# Patient Record
Sex: Male | Born: 1980 | Race: Black or African American | Hispanic: No | Marital: Single | Smoking: Current every day smoker
Health system: Southern US, Community
[De-identification: ages and names within clinical notes are randomized; demographics above are authoritative.]

## PROBLEM LIST (undated history)

## (undated) DIAGNOSIS — F419 Anxiety disorder, unspecified: Secondary | ICD-10-CM

## (undated) DIAGNOSIS — F1910 Other psychoactive substance abuse, uncomplicated: Secondary | ICD-10-CM

## (undated) DIAGNOSIS — B20 Human immunodeficiency virus [HIV] disease: Secondary | ICD-10-CM

## (undated) HISTORY — PX: WISDOM TOOTH EXTRACTION: SHX21

---

## 2013-07-20 ENCOUNTER — Emergency Department (HOSPITAL_COMMUNITY)
Admission: EM | Admit: 2013-07-20 | Discharge: 2013-07-21 | Disposition: A | Discharge status: Home / Self Care | Payer: BC Managed Care – PPO | Attending: Emergency Medicine | Admitting: Emergency Medicine

## 2013-07-20 ENCOUNTER — Encounter (HOSPITAL_COMMUNITY): Payer: Self-pay | Admitting: Emergency Medicine

## 2013-07-20 DIAGNOSIS — E876 Hypokalemia: Secondary | ICD-10-CM | POA: Insufficient documentation

## 2013-07-20 DIAGNOSIS — F17200 Nicotine dependence, unspecified, uncomplicated: Secondary | ICD-10-CM | POA: Insufficient documentation

## 2013-07-20 DIAGNOSIS — IMO0001 Reserved for inherently not codable concepts without codable children: Secondary | ICD-10-CM | POA: Insufficient documentation

## 2013-07-20 DIAGNOSIS — F1910 Other psychoactive substance abuse, uncomplicated: Secondary | ICD-10-CM

## 2013-07-20 DIAGNOSIS — R209 Unspecified disturbances of skin sensation: Secondary | ICD-10-CM | POA: Insufficient documentation

## 2013-07-20 DIAGNOSIS — R002 Palpitations: Secondary | ICD-10-CM | POA: Insufficient documentation

## 2013-07-20 DIAGNOSIS — F411 Generalized anxiety disorder: Secondary | ICD-10-CM | POA: Insufficient documentation

## 2013-07-20 DIAGNOSIS — F1510 Other stimulant abuse, uncomplicated: Secondary | ICD-10-CM | POA: Insufficient documentation

## 2013-07-20 DIAGNOSIS — R000 Tachycardia, unspecified: Secondary | ICD-10-CM | POA: Insufficient documentation

## 2013-07-20 LAB — BASIC METABOLIC PANEL
BUN: 13 mg/dL (ref 6–23)
Calcium: 10.4 mg/dL (ref 8.4–10.5)
Chloride: 101 mEq/L (ref 96–112)
Creatinine, Ser: 1.08 mg/dL (ref 0.50–1.35)
GFR calc Af Amer: >90 mL/min (ref >90)
GFR calc non Af Amer: 89 mL/min — ABNORMAL LOW (ref >90)
Potassium: 3.4 mEq/L — ABNORMAL LOW (ref 3.5–5.1)

## 2013-07-20 LAB — POCT I-STAT TROPONIN I: Troponin i, poc: 0.03 ng/mL (ref 0.00–0.08)

## 2013-07-20 LAB — CBC
MCHC: 36.7 g/dL — ABNORMAL HIGH (ref 30.0–36.0)
Platelets: 296 10*3/uL (ref 150–400)
RDW: 11.2 % — ABNORMAL LOW (ref 11.5–15.5)
WBC: 7 10*3/uL (ref 4.0–10.5)

## 2013-07-20 MED ORDER — POTASSIUM CHLORIDE CRYS ER 20 MEQ PO TBCR
40.0000 meq | EXTENDED_RELEASE_TABLET | Freq: Once | ORAL | Status: AC
  Administered 2013-07-20: 40 meq via ORAL
  Filled 2013-07-20: qty 2

## 2013-07-20 MED ORDER — LORAZEPAM 2 MG/ML IJ SOLN
1.0000 mg | Freq: Once | INTRAMUSCULAR | Status: AC
  Administered 2013-07-20: 1 mg via INTRAVENOUS
  Filled 2013-07-20: qty 1

## 2013-07-20 MED ORDER — SODIUM CHLORIDE 0.9 % IV BOLUS (SEPSIS)
1000.0000 mL | Freq: Once | INTRAVENOUS | Status: AC
  Administered 2013-07-20: 1000 mL via INTRAVENOUS

## 2013-07-20 NOTE — ED Provider Notes (Signed)
CSN: [id number]     Arrival date & time 07/20/13  2058 History   First MD Initiated Contact with Patient 07/20/13 2118     Chief Complaint  Patient presents with  . Tachycardia   (Consider location/radiation/quality/duration/timing/severity/associated sxs/prior Treatment) HPI Pt presents after injecting crystal meth.  He states he feels diffuse body pain and feels his heart racing and pounding.  Pt describes feeling pain in all 4 extremities, back and chest.  Pt feels that he is breathing fast and anxious.  He feels tingling in all 4 extremities.  He states he has used crystal meth in the past and has had similar symptoms.  Pt is requesting to leave the ED quickly as he states he needs to get to work in less than 2 hours.  No fainting.  Denies using other drugs or drinking alcohol, denies using cocaine.  There are no other associated systemic symptoms, there are no other alleviating or modifying factors.   History reviewed. No pertinent past medical history. History reviewed. No pertinent past surgical history. History reviewed. No pertinent family history. History  Substance Use Topics  . Smoking status: Current Every Day Smoker    Types: Cigarettes  . Smokeless tobacco: Not on file  . Alcohol Use: No    Review of Systems ROS reviewed and all otherwise negative except for mentioned in HPI  Allergies  Review of patient's allergies indicates no known allergies.  Home Medications  No current outpatient prescriptions on file. BP 137/94  Pulse 100  Temp(Src) 98.1 F (36.7 C) (Oral)  Resp 19  SpO2 100% Vitals reviewed Physical Exam Physical Examination: General appearance - alert, anxious appearing, and in no distress Mental status - alert, oriented to person, place, and time Eyes - pupils equal and reactive, mild conjunctival injection, no scleral icterus Mouth - mucous membranes moist, pharynx normal without lesions Chest - clear to auscultation, no wheezes, rales or rhonchi,  symmetric air entry Heart - normal rate, regular rhythm, normal S1, S2, no murmurs, rubs, clicks or gallops Abdomen - soft, nontender, nondistended, no masses or organomegaly Extremities - peripheral pulses normal, no pedal edema, no clubbing or cyanosis Skin - normal coloration and turgor, no rashes  ED Course  Procedures (including critical care time) 11:26 PM on recheck patient is feeling much improved.  HR is 100 on my re-evaluation.  He states he feels much more calm and symptoms are improved.  He is finishing fluid bolus and HR increases to approx 110 upon sitting up.  Will discharge after fluids completed.   Labs Review Labs Reviewed  CBC - Abnormal; Notable for the following:    MCHC 36.7 (*)    RDW 11.2 (*)    All other components within normal limits  BASIC METABOLIC PANEL - Abnormal; Notable for the following:    Potassium 3.4 (*)    GFR calc non Af Amer 89 (*)    All other components within normal limits  POCT I-STAT TROPONIN I   Imaging Review No results found.  EKG Interpretation    Date/Time:  Monday July 20 2013 21:03:54 EST Ventricular Rate:  152 PR Interval:  126 QRS Duration: 90 QT Interval:  326 QTC Calculation: 518 R Axis:   85 Text Interpretation:  Sinus tachycardia Otherwise normal ECG No old tracing to compare            MDM   1. Substance abuse   2. Tachycardia   3. Palpitations   4. Hypokalemia    Pt  presenting with palpitations, tachycardia, diffuse body aches and anxiety after injecting crystal meth.  EKG shows sinus tach.  HR decreased after IV hydration and ativan.  He is feeling much improved after treatment.  Lungs are clear and there is no respiratory distress.  Pt concerned about missing work.  I have counseled him against the use of illicit drugs, he denies using cocaine.  He is requesting vicodin- and states he "doesn't want to have to buy it on the street".  His vitals are reassuring on recheck, I have advised ibuprofen for  body aches.  Doubt ACS or PE.  Discharged with strict return precautions.  Pt agreeable with plan.    Ethelda Chick, MD 07/21/13 [phone]

## 2013-07-20 NOTE — ED Notes (Signed)
Pt resting at this time. Pt voided 400cc urine.  No c/o at this time.

## 2013-07-20 NOTE — ED Notes (Signed)
Presents post one hour ago injection of crystal meth, became SOB, diaphoretic and feels like heart is beating out of chest. HR 150s-160s,  Diaphoretic. C/o chest pain and tingling and numbness in extremities.

## 2013-07-21 NOTE — ED Notes (Signed)
Pt dc to home. Pt sts understanding to dc instructions.pt requesting something for pain to take home. md asked and no new orders obtained.  Md advised pt to go home and rest.  Pt sts understanding to this.

## 2014-08-25 ENCOUNTER — Encounter (HOSPITAL_COMMUNITY): Payer: Self-pay | Admitting: *Deleted

## 2014-08-25 ENCOUNTER — Emergency Department (HOSPITAL_COMMUNITY)
Admission: EM | Admit: 2014-08-25 | Discharge: 2014-08-25 | Disposition: A | Discharge status: Home / Self Care | Payer: BC Managed Care – PPO | Attending: Emergency Medicine | Admitting: Emergency Medicine

## 2014-08-25 DIAGNOSIS — B350 Tinea barbae and tinea capitis: Secondary | ICD-10-CM | POA: Insufficient documentation

## 2014-08-25 DIAGNOSIS — K029 Dental caries, unspecified: Secondary | ICD-10-CM | POA: Diagnosis not present

## 2014-08-25 DIAGNOSIS — Z720 Tobacco use: Secondary | ICD-10-CM | POA: Diagnosis not present

## 2014-08-25 DIAGNOSIS — Z21 Asymptomatic human immunodeficiency virus [HIV] infection status: Secondary | ICD-10-CM | POA: Diagnosis not present

## 2014-08-25 DIAGNOSIS — K088 Other specified disorders of teeth and supporting structures: Secondary | ICD-10-CM | POA: Diagnosis present

## 2014-08-25 HISTORY — DX: Human immunodeficiency virus (HIV) disease: B20

## 2014-08-25 MED ORDER — HYDROCODONE-ACETAMINOPHEN 5-325 MG PO TABS: 1.0000 | ORAL_TABLET | ORAL | Status: DC | PRN

## 2014-08-25 MED ORDER — PENICILLIN V POTASSIUM 500 MG PO TABS: 500.0000 mg | ORAL_TABLET | Freq: Three times a day (TID) | ORAL | Status: DC

## 2014-08-25 MED ORDER — GRISEOFULVIN MICROSIZE 500 MG PO TABS: 500.0000 mg | ORAL_TABLET | Freq: Every day | ORAL | Status: DC

## 2014-08-25 NOTE — ED Provider Notes (Signed)
CSN: [id number]     Arrival date & time 08/25/14  2108 History  This chart was scribed for non-physician practitioner, Elpidio AnisShari Samayra Hebel, PA-C, working with No att. providers found, by Bronson CurbJacqueline Melvin, ED Scribe. This patient was seen in room TR09C/TR09C and the patient's care was started at 9:27 PM.   Chief Complaint  Patient presents with  . Dental Pain  . Rash  . STD check     The history is provided by the patient. No language interpreter was used.     HPI Comments: Douglas Dunn is a 33 y.o. male, with history of HIV, who presents to the Emergency Department complaining of right upper dental pain onset PTA. Patient reports history of similar dental pain that resulted in a tooth extraction. He suspects more teeth need to be extracted. He denies fever. Patient has dental insurance, and is a current smoker.   Patient is complaining of rash to the scalp for the past 3 month. He states he was placed on a week's worth of antifungal medication (Diflucan), without significant improvement. Patient is concerned this is related to HIV, and reports that his CD4 count is high. He also notes dietary changes, stating that he has been consuming approximately 4,000-5,000 calories per day.  Patient also notes exposure to Syphilis and was given Penicillin at the health department. He reports that he is to follow-up in the next month.   Past Medical History  Diagnosis Date  . HIV (human immunodeficiency virus infection)    History reviewed. No pertinent past surgical history. History reviewed. No pertinent family history. History  Substance Use Topics  . Smoking status: Current Every Day Smoker    Types: Cigarettes  . Smokeless tobacco: Not on file  . Alcohol Use: No    Review of Systems  Constitutional: Positive for appetite change (increase). Negative for fever.  HENT: Positive for dental problem.   Skin: Positive for rash.      Allergies  Review of patient's allergies indicates no known  allergies.  Home Medications   Prior to Admission medications   Not on File   Triage Vitals: BP 154/99 mmHg  Pulse 110  Temp(Src) 98 F (36.7 C)  Resp 18  Ht 6\' 1"  (1.854 m)  Wt 184 lb (83.462 kg)  BMI 24.28 kg/m2  SpO2 98%  Physical Exam  Constitutional: He is oriented to person, place, and time. He appears well-developed and well-nourished. No distress.  HENT:  Head: Normocephalic and atraumatic.  Partially edentulous. Severe decay of number 4 and 5 with associated gingival erythema. No facial swelling. No adennopathy.  Eyes: Conjunctivae and EOM are normal.  Neck: Neck supple. No tracheal deviation present.  Cardiovascular: Normal rate.   Pulmonary/Chest: Effort normal. No respiratory distress.  Musculoskeletal: Normal range of motion.  Neurological: He is alert and oriented to person, place, and time.  Skin: Skin is warm and dry.  Multiple areas of alopecia. No erythema. Mildly tender.  Psychiatric: He has a normal mood and affect. His behavior is normal.  Nursing note and vitals reviewed.   ED Course  Procedures (including critical care time)  DIAGNOSTIC STUDIES: Oxygen Saturation is 98% on room air, normal by my interpretation.    COORDINATION OF CARE: At 2134 Discussed treatment plan with patient. Patient agrees.   Labs Review Labs Reviewed - No data to display  Imaging Review No results found.   EKG Interpretation None      MDM   Final diagnoses:  None    1.  Dental pain 2. Tinea capitis  The patient has a concern about previous positive test for syphilis. He reports he was given penicillin. No current symptoms. He is reassured that no further testing is needed through the emergency department and is encouraged to follow up with his PCP.  He is started on griseofulvin for tinea capitis. Discussed the needed length of treatment. He [facility] and is encouraged to do this for recheck.  He is  treated for dental pain with abx and pain management.   I personally performed the services described in this documentation, which was scribed in my presence. The recorded information has been reviewed and is accurate.     Arnoldo HookerShari A Cristiana Yochim, PA-C 08/26/14 0000  Flint MelterElliott L Wentz, MD 08/26/14 [phone]

## 2014-08-25 NOTE — ED Notes (Signed)
pts vital signs updated pt awaiting discharge paperwork at bedside.  

## 2014-08-25 NOTE — ED Notes (Signed)
Pt in c/o toothache, rash to scalp, and states he was exposed to syphilis a few months ago- states he was treated at the health department but he hasn't been able to follow up for a re-check

## 2014-08-25 NOTE — Discharge Instructions (Signed)
Dental Caries °Dental caries (also called tooth decay) is the most common oral disease. It can occur at any age but is more common in children and young adults.  °HOW DENTAL CARIES DEVELOPS  °The process of decay begins when bacteria and foods (particularly sugars and starches) combine in your mouth to produce plaque. Plaque is a substance that sticks to the hard, outer surface of a tooth (enamel). The bacteria in plaque produce acids that attack enamel. These acids may also attack the root surface of a tooth (cementum) if it is exposed. Repeated attacks dissolve these surfaces and create holes in the tooth (cavities). If left untreated, the acids destroy the other layers of the tooth.  °RISK FACTORS °· Frequent sipping of sugary beverages.   °· Frequent snacking on sugary and starchy foods, especially those that easily get stuck in the teeth.   °· Poor oral hygiene.   °· Dry mouth.   °· Substance abuse such as methamphetamine abuse.   °· Broken or poor-fitting dental restorations.   °· Eating disorders.   °· Gastroesophageal reflux disease (GERD).   °· Certain radiation treatments to the head and neck. °SYMPTOMS °In the early stages of dental caries, symptoms are seldom present. Sometimes white, chalky areas may be seen on the enamel or other tooth layers. In later stages, symptoms may include: °· Pits and holes on the enamel. °· Toothache after sweet, hot, or cold foods or drinks are consumed. °· Pain around the tooth. °· Swelling around the tooth. °DIAGNOSIS  °Most of the time, dental caries is detected during a regular dental checkup. A diagnosis is made after a thorough medical and dental history is taken and the surfaces of your teeth are checked for signs of dental caries. Sometimes special instruments, such as lasers, are used to check for dental caries. Dental X-ray exams may be taken so that areas not visible to the eye (such as between the contact areas of the teeth) can be checked for cavities.    °TREATMENT  °If dental caries is in its early stages, it may be reversed with a fluoride treatment or an application of a remineralizing agent at the dental office. Thorough brushing and flossing at home is needed to aid these treatments. If it is in its later stages, treatment depends on the location and extent of tooth destruction:  °· If a small area of the tooth has been destroyed, the destroyed area will be removed and cavities will be filled with a material such as gold, silver amalgam, or composite resin.   °· If a large area of the tooth has been destroyed, the destroyed area will be removed and a cap (crown) will be fitted over the remaining tooth structure.   °· If the center part of the tooth (pulp) is affected, a procedure called a root canal will be needed before a filling or crown can be placed.   °· If most of the tooth has been destroyed, the tooth may need to be pulled (extracted). °HOME CARE INSTRUCTIONS °You can prevent, stop, or reverse dental caries at home by practicing good oral hygiene. Good oral hygiene includes: °· Thoroughly cleaning your teeth at least twice a day with a toothbrush and dental floss.   °· Using a fluoride toothpaste. A fluoride mouth rinse may also be used if recommended by your dentist or health care provider.   °· Restricting the amount of sugary and starchy foods and sugary liquids you consume.   °· Avoiding frequent snacking on these foods and sipping of these liquids.   °· Keeping regular visits with   a dentist for checkups and cleanings. PREVENTION   Practice good oral hygiene.  Consider a dental sealant. A dental sealant is a coating material that is applied by your dentist to the pits and grooves of teeth. The sealant prevents food from being trapped in them. It may protect the teeth for several years.  Ask about fluoride supplements if you live in a community without fluorinated water or with water that has a low fluoride content. Use fluoride supplements  as directed by your dentist or health care provider.  Allow fluoride varnish applications to teeth if directed by your dentist or health care provider. Document Released: 05/12/2002 Document Revised: 01/04/2014 Document Reviewed: 08/22/2012 [facility] Patient Information 2015 [place], [place]. This information is not intended to replace advice given to you by your health care provider. Make sure you discuss any questions you have with your health care provider. Ringworm of the Scalp Tinea Capitis is also called scalp ringworm. It is a fungal infection of the skin on the scalp seen mainly in children.  CAUSES  Scalp ringworm spreads from:  Other people.  Pets (cats and dogs) and animals.  Bedding, hats, combs or brushes shared with an infected person  Theater seats that an infected person sat in. SYMPTOMS  Scalp ringworm causes the following symptoms:  Flaky scales that look like dandruff.  Circles of thick, raised red skin.  Hair loss.  Red pimples or pustules.  Swollen glands in the back of the neck.  Itching. DIAGNOSIS  A skin scraping or infected hairs will be sent to test for fungus. Testing can be done either by looking under the microscope (KOH examination) or by doing a culture (test to try to grow the fungus). A culture can take up to 2 weeks to come back. TREATMENT   Scalp ringworm must be treated with medicine by mouth to kill the fungus for 6 to 8 weeks.  Medicated shampoos (ketoconazole or selenium sulfide shampoo) may be used to decrease the shedding of fungal spores from the scalp.  Steroid medicines are used for severe cases that are very inflamed in conjunction with antifungal medication.  It is important that any family members or pets that have the fungus be treated. HOME CARE INSTRUCTIONS   Be sure to treat the rash completely - follow your caregiver's instructions. It can take a month or more to treat. If you do not treat it long enough, the rash can come  back.  Watch for other cases in your family or pets.  Do not share brushes, combs, barrettes, or hats. Do not share towels.  Combs, brushes, and hats should be cleaned carefully and natural bristle brushes must be thrown away.  It is not necessary to shave the scalp or wear a hat during treatment.  Children may attend school once they start treatment with the oral medicine.  Be sure to follow up with your caregiver as directed to be sure the infection is gone. SEEK MEDICAL CARE IF:   Rash is worse.  Rash is spreading.  Rash returns after treatment is completed.  The rash is not better in 2 weeks with treatment. Fungal infections are slow to respond to treatment. Some redness may remain for several weeks after the fungus is gone. SEEK IMMEDIATE MEDICAL CARE IF:  The area becomes red, warm, tender, and swollen.  Pus is oozing from the rash.  You or your child has an oral temperature above 102 F (38.9 C), not controlled by medicine. Document Released: 08/17/2000 Document  Revised: 11/12/2011 Document Reviewed: 09/29/2008 ExitCare Patient Information 2015 [place], [place]. This information is not intended to replace advice given to you by your health care provider. Make sure you discuss any questions you have with your health care provider.

## 2014-09-03 ENCOUNTER — Encounter (HOSPITAL_COMMUNITY): Payer: Self-pay | Admitting: Physical Medicine and Rehabilitation

## 2014-09-03 ENCOUNTER — Emergency Department (HOSPITAL_COMMUNITY)
Admission: EM | Admit: 2014-09-03 | Discharge: 2014-09-03 | Disposition: A | Discharge status: Home / Self Care | Payer: BC Managed Care – PPO | Attending: Emergency Medicine | Admitting: Emergency Medicine

## 2014-09-03 ENCOUNTER — Emergency Department (HOSPITAL_COMMUNITY): Payer: BC Managed Care – PPO

## 2014-09-03 DIAGNOSIS — Z21 Asymptomatic human immunodeficiency virus [HIV] infection status: Secondary | ICD-10-CM | POA: Diagnosis not present

## 2014-09-03 DIAGNOSIS — R000 Tachycardia, unspecified: Secondary | ICD-10-CM | POA: Insufficient documentation

## 2014-09-03 DIAGNOSIS — T43621A Poisoning by amphetamines, accidental (unintentional), initial encounter: Secondary | ICD-10-CM | POA: Diagnosis not present

## 2014-09-03 DIAGNOSIS — R079 Chest pain, unspecified: Secondary | ICD-10-CM | POA: Diagnosis present

## 2014-09-03 DIAGNOSIS — Z792 Long term (current) use of antibiotics: Secondary | ICD-10-CM | POA: Diagnosis not present

## 2014-09-03 DIAGNOSIS — Z720 Tobacco use: Secondary | ICD-10-CM | POA: Diagnosis not present

## 2014-09-03 DIAGNOSIS — R0602 Shortness of breath: Secondary | ICD-10-CM | POA: Insufficient documentation

## 2014-09-03 DIAGNOSIS — R0789 Other chest pain: Secondary | ICD-10-CM

## 2014-09-03 DIAGNOSIS — F419 Anxiety disorder, unspecified: Secondary | ICD-10-CM | POA: Diagnosis not present

## 2014-09-03 DIAGNOSIS — F1510 Other stimulant abuse, uncomplicated: Secondary | ICD-10-CM

## 2014-09-03 DIAGNOSIS — Z79899 Other long term (current) drug therapy: Secondary | ICD-10-CM | POA: Diagnosis not present

## 2014-09-03 LAB — CBC WITH DIFFERENTIAL/PLATELET
Basophils Absolute: 0 10*3/uL (ref 0.0–0.1)
Basophils Relative: 0 % (ref 0–1)
Eosinophils Absolute: 0 10*3/uL (ref 0.0–0.7)
Eosinophils Relative: 0 % (ref 0–5)
HEMATOCRIT: 39.3 % (ref 39.0–52.0)
Hemoglobin: 14.1 g/dL (ref 13.0–17.0)
LYMPHS PCT: 11 % — AB (ref 12–46)
Lymphs Abs: 1 10*3/uL (ref 0.7–4.0)
MCH: 33.5 pg (ref 26.0–34.0)
MCHC: 35.9 g/dL (ref 30.0–36.0)
MCV: 93.3 fL (ref 78.0–100.0)
MONO ABS: 0.8 10*3/uL (ref 0.1–1.0)
Monocytes Relative: 9 % (ref 3–12)
NEUTROS ABS: 7.5 10*3/uL (ref 1.7–7.7)
Neutrophils Relative %: 80 % — ABNORMAL HIGH (ref 43–77)
Platelets: 190 10*3/uL (ref 150–400)
RBC: 4.21 MIL/uL — ABNORMAL LOW (ref 4.22–5.81)
RDW: 11.4 % — ABNORMAL LOW (ref 11.5–15.5)
WBC: 9.4 10*3/uL (ref 4.0–10.5)

## 2014-09-03 LAB — BASIC METABOLIC PANEL
Anion gap: 11 (ref 5–15)
BUN: 12 mg/dL (ref 6–23)
CHLORIDE: 101 meq/L (ref 96–112)
CO2: 23 mmol/L (ref 19–32)
CREATININE: 1.03 mg/dL (ref 0.50–1.35)
Calcium: 10.1 mg/dL (ref 8.4–10.5)
GFR calc Af Amer: >90 mL/min (ref >90)
GFR calc non Af Amer: >90 mL/min (ref >90)
Glucose, Bld: 111 mg/dL — ABNORMAL HIGH (ref 70–99)
Potassium: 3.5 mmol/L (ref 3.5–5.1)
Sodium: 135 mmol/L (ref 135–145)

## 2014-09-03 LAB — URINALYSIS, ROUTINE W REFLEX MICROSCOPIC
BILIRUBIN URINE: NEGATIVE
Glucose, UA: NEGATIVE mg/dL
HGB URINE DIPSTICK: NEGATIVE
Ketones, ur: NEGATIVE mg/dL
Leukocytes, UA: NEGATIVE
Nitrite: NEGATIVE
PH: 7.5 (ref 5.0–8.0)
Protein, ur: NEGATIVE mg/dL
SPECIFIC GRAVITY, URINE: 1.004 — AB (ref 1.005–1.030)
Urobilinogen, UA: 0.2 mg/dL (ref 0.0–1.0)

## 2014-09-03 LAB — I-STAT TROPONIN, ED: Troponin i, poc: 0 ng/mL (ref 0.00–0.08)

## 2014-09-03 MED ORDER — SODIUM CHLORIDE 0.9 % IV BOLUS (SEPSIS)
1000.0000 mL | Freq: Once | INTRAVENOUS | Status: AC
  Administered 2014-09-03: 1000 mL via INTRAVENOUS

## 2014-09-03 MED ORDER — LORAZEPAM 2 MG/ML IJ SOLN
1.0000 mg | Freq: Once | INTRAMUSCULAR | Status: AC
  Administered 2014-09-03: 1 mg via INTRAVENOUS
  Filled 2014-09-03: qty 1

## 2014-09-03 NOTE — ED Notes (Signed)
PA at bedside.

## 2014-09-03 NOTE — ED Notes (Signed)
Pt has wet pants. Pt given new sheet and offered dry scrub pants to put on, pt declined to take off pants or to change into new pants.

## 2014-09-03 NOTE — ED Notes (Signed)
Pt transported to xray 

## 2014-09-03 NOTE — ED Notes (Signed)
Pt can not find his black and gray hat that he came in with. Called to x-ray, x-ray is looking for pts hat. Pt moved to chair in hallway.

## 2014-09-03 NOTE — ED Provider Notes (Signed)
CSN: [id number]     Arrival date & time 09/03/14  1100 History   First MD Initiated Contact with Patient 09/03/14 1109     Chief Complaint  Patient presents with  . Chest Pain  . Shortness of Breath  . Drug Problem     (Consider location/radiation/quality/duration/timing/severity/associated sxs/prior Treatment) HPI [name] is a 34 year old male with past medical history of HIV who presents the ER with complaint of chest pain, shortness of breath, anxiousness. Patient reports smoking methamphetamine approximately 2-3 hours ago, and is now experiencing a constant, heaviness in his chest, mild shortness of breath and anxiousness. Patient reports having a similar episode several months ago after smoking methamphetamines as well. Patient denies using any other street drugs today, no cocaine, no alcohol. Patient denies headache, dizziness, blurred vision, palpitations, nausea, vomiting, abdominal pain, diarrhea, dysuria.  Past Medical History  Diagnosis Date  . HIV (human immunodeficiency virus infection)    History reviewed. No pertinent past surgical history. History reviewed. No pertinent family history. History  Substance Use Topics  . Smoking status: Current Every Day Smoker    Types: Cigarettes  . Smokeless tobacco: Not on file  . Alcohol Use: No    Review of Systems  Constitutional: Negative for fever.  HENT: Negative for trouble swallowing.   Eyes: Negative for visual disturbance.  Respiratory: Positive for chest tightness and shortness of breath.   Cardiovascular: Positive for chest pain.  Gastrointestinal: Negative for nausea, vomiting and abdominal pain.  Genitourinary: Negative for dysuria.  Musculoskeletal: Negative for neck pain.  Skin: Negative for rash.  Neurological: Negative for dizziness, weakness and numbness.  Psychiatric/Behavioral: The patient is nervous/anxious.       Allergies  Review of patient's allergies indicates no known allergies.  Home  Medications   Prior to Admission medications   Medication Sig Start Date End Date Taking? Authorizing Provider  griseofulvin (GRIFULVIN V) 500 MG tablet Take 1 tablet (500 mg total) by mouth daily. 08/25/14   Shari A Upstill, PA-C  HYDROcodone-acetaminophen (NORCO/VICODIN) 5-325 MG per tablet Take 1-2 tablets by mouth every 4 (four) hours as needed. 08/25/14   Shari A Upstill, PA-C  penicillin v potassium (VEETID) 500 MG tablet Take 1 tablet (500 mg total) by mouth 3 (three) times daily. 08/25/14   Shari A Upstill, PA-C   BP 154/89 mmHg  Pulse 95  Temp(Src) 98.6 F (37 C) (Oral)  Resp 20  Ht  (1.854 m)  Wt 180 lb (81.647 kg)  BMI 23.75 kg/m2  SpO2 100% Physical Exam  Constitutional: He is oriented to person, place, and time. He appears well-developed and well-nourished. No distress.  Mildly diaphoretic and anxious appearing  HENT:  Head: Normocephalic and atraumatic.  Mouth/Throat: Oropharynx is clear and moist. No oropharyngeal exudate.  Eyes: Right eye exhibits no discharge. Left eye exhibits no discharge. No scleral icterus.  Neck: Normal range of motion.  Cardiovascular: Regular rhythm, S1 normal, S2 normal, normal heart sounds and normal pulses.  Tachycardia present.   No murmur heard. Tachycardia to 120  Pulmonary/Chest: Effort normal and breath sounds normal. No respiratory distress.  Abdominal: Soft. There is no tenderness.  Musculoskeletal: Normal range of motion. He exhibits no edema or tenderness.  Neurological: He is alert and oriented to person, place, and time. No cranial nerve deficit. Coordination normal.  Skin: Skin is warm and dry. No rash noted. He is not diaphoretic.  Psychiatric: He has a normal mood and affect.  Nursing note and vitals reviewed.  ED Course  Procedures (including critical care time) Labs Review Labs Reviewed  CBC WITH DIFFERENTIAL - Abnormal; Notable for the following:    RBC 4.21 (*)    RDW 11.4 (*)    Neutrophils Relative % 80  (*)    Lymphocytes Relative 11 (*)    All other components within normal limits  BASIC METABOLIC PANEL - Abnormal; Notable for the following:    Glucose, Bld 111 (*)    All other components within normal limits  URINALYSIS, ROUTINE W REFLEX MICROSCOPIC - Abnormal; Notable for the following:    Specific Gravity, Urine 1.004 (*)    All other components within normal limits  I-STAT TROPOININ, ED    Imaging Review Dg Chest 2 View  09/03/2014   CLINICAL DATA:  Chest pain, shortness of breath, and cough since this morning.  EXAM: CHEST  2 VIEW  COMPARISON:  August 28, 2013  FINDINGS: The heart size and mediastinal contours are within normal limits. There is no focal infiltrate, pulmonary edema, or pleural effusion. The visualized skeletal structures are unremarkable.  IMPRESSION: No active cardiopulmonary disease.   Electronically Signed   By: Sherian Rein M.D.   On: 09/03/2014 12:38     EKG Interpretation   Date/Time:  Friday September 03 2014 11:08:16 EST Ventricular Rate:  124 PR Interval:  166 QRS Duration: 92 QT Interval:  300 QTC Calculation: 431 R Axis:   123 Text Interpretation:  Sinus tachycardia Lateral infarct , age undetermined  Abnormal ECG No significant change since last tracing Confirmed by KNAPP   MD-J, JON ([id number]) on 09/03/2014 11:17:49 AM      MDM   Final diagnoses:  Chest discomfort  Methamphetamine abuse  Anxiousness   Patient here complaining of centralized, constant chest pain, shortness of breath and anxiety 3 hours after using methamphetamine. Patient reports use of methamphetamine approximately every 3-4 months to obtain its sexual side effects. Patient states he has had episodes of anxiety and chest pain with amphetamine use in the past. Will workup with rule out of ACS.  On reexamination after anxiolytics and fluid, patient reporting symptoms have subsided, stating he is asymptomatic at this time. Heart rate on reexam in the mid to high 90s. The patient  must less anxious, not complaining of any chest pain or shortness of breath. Symptoms thought to be due to methamphetamine use, labs reassuring and did not show evidence of acute pathology. No concern for ACS, PE, PNA. UA unremarkable. Troponin negative, EKG unremarkable for injury or ectopy. PERC negative, chest x-ray without evidence of active cardiopulmonary disease. Patient to be discharged at this time, and strongly encouraged to follow up with outpatient resources for substance abuse, and to find a primary care physician. I discussed return precautions with patient, and patient was agreeable to this plan. I discussed the dangers of using methamphetamines with patient, and patient verbalized understanding of this. I encouraged patient to call or return to the ER should he have any questions or concerns.  BP 154/89 mmHg  Pulse 95  Temp(Src) 98.6 F (37 C) (Oral)  Resp 20  Ht  (1.854 m)  Wt 180 lb (81.647 kg)  BMI 23.75 kg/m2  SpO2 100%  Signed,  Ladona Mow, PA-C 4:17 PM  Patient discussed with Dr. Linwood Dibbles, MD     Monte Fantasia, PA-C 09/03/14 1617  Linwood Dibbles, MD 09/03/14 [phone]

## 2014-09-03 NOTE — ED Notes (Signed)
Pt presents to department for evaluation of midsternal non radiating chest pain and SOB. Admits to smoking methamphetamine with friends this morning. Now states chest pain, heart "raing" sensation and SOB. Respirations unlabored. Pt diaphoretic and anxious in triage. Pt is alert and oriented x4.

## 2014-09-03 NOTE — Discharge Instructions (Signed)
Return to the ER for any severe chest pain, shortness of breath, diaphoresis or if your symptoms become related to exertion. Methamphetamines can very well cause chest pain or shortness of breath and anxiousness, and it is strongly recommended that you stop using them recreationally. Continued use of them can be highly addictive, and can result in multiple medical conditions up to and including death.  Stimulant Use Disorder-Methamphetamines Methamphetamines are one of a group of powerful drugs known as stimulants. Methamphetamine is a form of amphetamine. It is rarely used medically but is often misused because of the effects it produces. These effects include:  A feeling of extreme pleasure (euphoria).  Alertness.  High energy.  Increased sexuality. Common street names for methamphetamine are meth, crystal, ice, glass, and chalk. This drug can be taken by mouth, smoked, snorted, or dissolved in water and injected. Stimulants are addictive because they activate regions of the brain that are responsible for producing both the pleasurable sensation of "reward" and psychological dependence. Together, these actions account for loss of control and the rapid development of drug dependence. This means the user will become ill without the drug (withdrawal) and will need to keep using it to function.  Stimulant use disorder is use of stimulants that disrupts your daily life. It disrupts relationships with family and friends and how you do your job. Methamphetamine increases blood pressure and heart rate. Use can cause a heart attack or stroke. Methamphetamine can also cause death from irregular heart rate or seizures. SIGNS AND SYMPTOMS  Signs and symptoms of stimulant use disorder with methamphetamine include:  Use of methamphetamines in larger amounts or over a longer period than intended.  Unsuccessful attempts to cut down or control methamphetamine use.  A lot of time spent obtaining, using, or  recovering from the effects of methamphetamines.  A strong desire or urge to use methamphetamines (craving).  Continued use of methamphetamines in spite of major problems at work, school, or home because of use.  Continued use of methamphetamines in spite of relationship problems because of use.  Giving up or cutting down on important life activities because of use of methamphetamines.  Use of methamphetamines over and over in situations when it is physically hazardous, such as driving a car.  Continued use of methamphetamines in spite of a physical problem that is likely related to use. Physical problems can include:  Extreme weight loss.  Malnutrition.  Jaw clenching.  Severe dental problems (meth mouth).  Lung problems (due to smoking).  Skin sores (due to scratching).  Infections such as human immunodeficiency virus (HIV) and hepatitis (from injecting methamphetamines).  Continued use of methamphetamines in spite of a mental problem that is likely related to use. Mental problems can include:  Memory problems.  Schizophrenia-like symptoms.  Depression.  Bipolar mood swings.  Violent behavior.  Anxiety.  Sleep problems.  Need to use more and more methamphetamines to get the same effect, or lessened effect over time with use of the same amount (tolerance).  Having withdrawal symptoms when methamphetamine use is stopped, or using methamphetamines to reduce or avoid withdrawal symptoms. Withdrawal symptoms include:  Depressed or irritable mood.  Low energy or restlessness.  Bad dreams.  Too little or too much sleep.  Increased appetite. DIAGNOSIS Stimulant use disorder is diagnosed by your health care provider. You may be asked questions about your methamphetamine use and how it affects your life. A physical exam may be done. A drug screen may be ordered. You may be referred  to a mental health professional. The diagnosis of stimulant use disorder requires at  least two symptoms within 12 months. The type of stimulant use disorder depends on the number of symptoms you have. The type may be:  Mild. Two or three signs and symptoms.  Moderate. Four or five signs and symptoms.  Severe. Six or more signs and symptoms. TREATMENT  There are two types of treatment:   Short-term (urgent) medical treatment. This helps to preserve life and prevent or minimize damage from the physical or mental problems related to methamphetamine use.   Long-term substance abuse treatment. This focuses on recovery from use disorder. It is provided by mental health professionals who have training in substance use disorders. It is usually a combination of counseling, support groups, and nonaddictive medicines that can reduce cravings or block the effects of methamphetamine. HOME CARE INSTRUCTIONS   Take medicines only as directed by your health care provider.  Identify the people and activities that trigger your methamphetamine use and avoid them.  Keep all follow-up visits as directed by your health care provider. SEEK MEDICAL CARE IF:  Your symptoms get worse or you relapse.  You are not able to take medicines as directed.  SEEK IMMEDIATE MEDICAL CARE IF:   You have serious thoughts about hurting yourself or others.  You have a seizure, chest pain, sudden weakness, or loss of speech or vision. FOR MORE INFORMATION  National Institute on Drug Abuse: http://www.price-smith.com/  Substance Abuse and Mental Health Services Administration: SkateOasis.com.pt Document Released: 04/25/2004 Document Revised: 01/04/2014 Document Reviewed: 09/02/2013 [facility] Patient Information 2015 [place], [place]. This information is not intended to replace advice given to you by your health care provider. Make sure you discuss any questions you have with your health care provider.   Chest Pain (Nonspecific) It is often hard to give a specific diagnosis for the cause of chest pain. There is  always a chance that your pain could be related to something serious, such as a heart attack or a blood clot in the lungs. You need to follow up with your health care provider for further evaluation. CAUSES   Heartburn.  Pneumonia or bronchitis.  Anxiety or stress.  Inflammation around your heart (pericarditis) or lung (pleuritis or pleurisy).  A blood clot in the lung.  A collapsed lung (pneumothorax). It can develop suddenly on its own (spontaneous pneumothorax) or from trauma to the chest.  Shingles infection (herpes zoster virus). The chest wall is composed of bones, muscles, and cartilage. Any of these can be the source of the pain.  The bones can be bruised by injury.  The muscles or cartilage can be strained by coughing or overwork.  The cartilage can be affected by inflammation and become sore (costochondritis). DIAGNOSIS  Lab tests or other studies may be needed to find the cause of your pain. Your health care provider may have you take a test called an ambulatory electrocardiogram (ECG). An ECG records your heartbeat patterns over a 24-hour period. You may also have other tests, such as:  Transthoracic echocardiogram (TTE). During echocardiography, sound waves are used to evaluate how blood flows through your heart.  Transesophageal echocardiogram (TEE).  Cardiac monitoring. This allows your health care provider to monitor your heart rate and rhythm in real time.  Holter monitor. This is a portable device that records your heartbeat and can help diagnose heart arrhythmias. It allows your health care provider to track your heart activity for several days, if needed.  Stress tests by  exercise or by giving medicine that makes the heart beat faster. TREATMENT   Treatment depends on what may be causing your chest pain. Treatment may include:  Acid blockers for heartburn.  Anti-inflammatory medicine.  Pain medicine for inflammatory conditions.  Antibiotics if an  infection is present.  You may be advised to change lifestyle habits. This includes stopping smoking and avoiding alcohol, caffeine, and chocolate.  You may be advised to keep your head raised (elevated) when sleeping. This reduces the chance of acid going backward from your stomach into your esophagus. Most of the time, nonspecific chest pain will improve within 2-3 days with rest and mild pain medicine.  HOME CARE INSTRUCTIONS   If antibiotics were prescribed, take them as directed. Finish them even if you start to feel better.  For the next few days, avoid physical activities that bring on chest pain. Continue physical activities as directed.  Do not use any tobacco products, including cigarettes, chewing tobacco, or electronic cigarettes.  Avoid drinking alcohol.  Only take medicine as directed by your health care provider.  Follow your health care provider's suggestions for further testing if your chest pain does not go away.  Keep any follow-up appointments you made. If you do not go to an appointment, you could develop lasting (chronic) problems with pain. If there is any problem keeping an appointment, call to reschedule. SEEK MEDICAL CARE IF:   Your chest pain does not go away, even after treatment.  You have a rash with blisters on your chest.  You have a fever. SEEK IMMEDIATE MEDICAL CARE IF:   You have increased chest pain or pain that spreads to your arm, neck, jaw, back, or abdomen.  You have shortness of breath.  You have an increasing cough, or you cough up blood.  You have severe back or abdominal pain.  You feel nauseous or vomit.  You have severe weakness.  You faint.  You have chills. This is an emergency. Do not wait to see if the pain will go away. Get medical help at once. Call your local emergency services (911 in U.S[place] Do not drive yourself to the hospital. MAKE SURE YOU:   Understand these instructions.  Will watch your condition.  Will  get help right away if you are not doing well or get worse. Document Released: 05/30/2005 Document Revised: 08/25/2013 Document Reviewed: 03/25/2008 [facility] Patient Information 2015 [place], [place]. This information is not intended to replace advice given to you by your health care provider. Make sure you discuss any questions you have with your health care provider.   Emergency Department Resource Guide 1) Find a Doctor and Pay Out of Pocket Although you won't have to find out who is covered by your insurance plan, it is a good idea to ask around and get recommendations. You will then need to call the office and see if the doctor you have chosen will accept you as a new patient and what types of options they offer for patients who are self-pay. Some doctors offer discounts or will set up payment plans for their patients who do not have insurance, but you will need to ask so you aren't surprised when you get to your appointment.  2) Contact Your Local Health Department Not all health departments have doctors that can see patients for sick visits, but many do, so it is worth a call to see if yours does. If you don't know where your local health department is, you can check in your  phone book. The CDC also has a tool to help you locate your state's health department, and many state websites also have listings of all of their local health departments.  3) Find a Walk-in Clinic If your illness is not likely to be very severe or complicated, you may want to try a walk in clinic. These are popping up all over the country in pharmacies, drugstores, and shopping centers. They're usually staffed by nurse practitioners or physician assistants that have been trained to treat common illnesses and complaints. They're usually fairly quick and inexpensive. However, if you have serious medical issues or chronic medical problems, these are probably not your best option.  No Primary Care Doctor: - Call Health Connect  at  [phone] - they can help you locate a primary care doctor that  accepts your insurance, provides certain services, etc. - Physician Referral Service- [phone]  Chronic Pain Problems: Organization         Address  Phone   Notes  [facility]  [phone] Patients need to be referred by their primary care doctor.   Medication Assistance: Organization         Address  Phone   Notes  [facility] Medication [facility] [address] [place] [place], [place] [address] [phone] --Must be a resident of [facility] -- Must have NO insurance coverage whatsoever (no Medicaid/ Medicare, etc.) -- The pt. MUST have a primary care doctor that directs their care regularly and follows them in the community   [facility]  [phone]   [employer]  [phone]    Agencies that provide inexpensive medical care: Organization         Address  Phone   Notes  [facility]  [phone]   [facility]    [phone]   [facility] [address] [place], [place] [address] [phone]   [facility] [address] [place] [place] [phone]   Planned Parenthood    [phone]   [facility]    [phone]   [facility]  [address], [place] Phone:  [phone], Fax:  [phone] Hours of Operation:  9 am - 6 pm, M-F.  Also accepts Medicaid/Medicare and self-pay.  [facility]  [address], [place] Phone: [phone], Fax: [phone]. Hours of Operation:  8:30 am - 5:30 pm, M-F.  Also accepts Medicaid and self-pay.  [facility] [place] [address] [place] Phone: [phone]   [facility] [address] Natasha Bence [place], [place] [phone], Ext. [phone] Mondays & Thursdays: 7-9 AM.  First 15 patients are seen on a first come, first serve basis.     Medicaid-accepting [facility] Providers:  Organization         Address  Phone   Notes  [facility] [address], [place] [phone] Also accepts self-pay patients.  [facility] [address] Laurell Josephs [place], [place]  [phone]   [facility] [address], [place] [phone]   [facility] Family Medicine [address], [place] [phone]   Renaye Rakers [address], [place]   [phone] Only accepts [place] Access [place] patients after they have their name applied to their card.  Self-Pay (no insurance) in [facility]:  Organization         Address  Phone   Notes  Sickle Cell Patients, [facility] Internal Medicine [address] [place], [place] [phone]   [facility] Urgent Care [address] [place], [place] [phone]   [facility] [place]  [address], [place] [phone]   [facility]/Dr. Osei-Bonsu  [address] [place] or [address], [place] [phone] Phone number for both [place] and [place] locations is the same.  [facility]gent [facility] [address] [place] [phone]   [facility] [address] [place] or [address] 256 314 1578 [phone]   [facility] [address], [place] [phone], phone; [phone], fax Sees patients 1st and 3rd Saturday of every month.  Must not qualify for public or private insurance (i.e. Medicaid, Medicare, [place] Health Choice, Veterans' Benefits)  Household income should be no more than 200% of the poverty level The clinic cannot treat you if you are pregnant or think you are pregnant  Sexually transmitted diseases are not treated at the clinic.    Dental Care: Organization         Address  Phone  Notes  [facility]  Department of [facility] [address] [place], [place] [phone] Accepts children up to age 18 who are enrolled in [place] or [place] Health Choice; pregnant women with a Medicaid card; and children who have applied for Medicaid or [place] Health Choice, but were declined, whose parents can pay a reduced fee at time of service.  [facility] of [facility]  [address], [place] [phone] Accepts children up to age 57 who are enrolled in [place] or [place] Health Choice; pregnant women with a Medicaid card; and children who have applied for Medicaid or [place] Health Choice, but were declined, whose parents can pay a reduced fee at time of service.  [facility]  [address] [place], [place] [phone] Patients are seen by appointment only. Walk-ins are not accepted. [facility] will see patients 42 years of age and older. Monday - Tuesday (8am-5pm) Most Wednesdays (8:30-5pm) $30 per visit, cash only  [facility] Adult [facility] Access [facility]  [address], [facility] [phone] Patients are seen by appointment only. Walk-ins are not accepted. [facility] will see patients 53 years of age and older. One Wednesday Evening (Monthly: Volunteer Based).  $30 per visit, cash only  [employer]  [phone] for adults; Children under age 5, call Graduate Pediatric Dentistry at [phone]. Children aged 55-14, please call [phone] to request a pediatric application.  Dental services are provided in all areas of dental care including fillings, crowns and bridges, complete and partial dentures, implants, gum treatment, root canals, and extractions. Preventive care is also provided. Treatment is provided to both adults and children. Patients are selected via a lottery and there is often a waiting list.   [facility] [address], [place]  [phone]  [email or web address]   [facility] [address] [place], [place] [phone], Ext. [phone] Second and Fourth Thursday of each month, opens at 6:30 AM; Clinic ends at 9 AM.  Patients are seen on a first-come first-served basis, and a limited number  are seen during each clinic.   [facility]  [address] Ether Griffins [place], [place] [phone]   Eligibility Requirements You must have lived in [place], [place], or [place] counties for at least the last three months.   You cannot be eligible for state or federal sponsored [place], including [employer], [place], or [employer].   You generally cannot be eligible for healthcare insurance through your employer.    How to apply: Eligibility screenings are held every Tuesday and Wednesday afternoon from 1:00 pm until 4:00 pm. You do not need an appointment for the interview!  [facility] [address] [place], [place] [phone]   [facility] Health Department  [phone]   [facility] Health Department  [phone]   [facility] Health Department  [phone]    Behavioral Health Resources in the Community: Intensive Outpatient Programs Organization         Address  Phone  Notes  [facility] [address], [place], [place] [phone]   [facility] [address], [place], [place] [phone]   [facility] [address], [place], [place]  [phone]   [facility] [address]  [place], [place] [phone] or [phone]   Substance Abuse Resources Organization         Address  Phone  Notes  [facility]  [phone]   [facility]  [phone]   [facility] [place]  [phone]   Floydene Flock  [phone]   Residential & Outpatient Substance Abuse Program  [phone]   Psychological Services Organization          Address  Phone  Notes  [facility] Behavioral Health  [phone]   [facility] Services  [phone]   [facility] [address], [place] [phone] or [phone]    Mobile Crisis Teams Organization         Address  Phone  Notes  [facility], Mobile Crisis Care Unit  [phone]   [facility]  [address] [place], [place] [phone]   Doristine Locks [address] [place] [phone]    Self-Help/Support Groups Organization         Address  Phone             Notes  [facility] [place] - variety of support groups  [phone]- [phone] Call for more information  Narcotics Anonymous (NA), [facility] [address], [employer] [place]  2 meetings at this location   Statistician         Address  Phone  Notes  [facility] [address],    [place]  [phone]   [facility]  [address] [place] [address] [place] [address], [place], [place] [phone]   [facility] [address] [place], [place] [phone] Admissions: 8am-3pm M-F  [facility] [address],    [place], [place] [phone]   [facility] [address] Starling Manns [place], [place] [phone]   [facility] [address],  [place], [place] [phone]   [facility] - Intensive Outpatient [address] Laurell Josephs [address], [place], [place] [phone]   [facility] (Addiction [facility] [address]  [place], [place] [phone] or [phone]   [facility] ([facility]) [address], [place], [place] [phone] Accepts Medicaid  [facility] [address]  [place] [phone] Substance Abuse/Addiction Treatment   [facility] Organization         Address  Phone  Notes  [facility]  [phone]   Angie Fava, PhD [address] Ervin Knack [place], [place]   [phone] or [phone]   [facility] Behavioral   [address] [place], [place] [phone]   [facility] [address], [place], [place] [phone] Insurance/Medicaid/sponsorship through [facility] and Families [address]                                    [place], [place] [phone] Therapy/tele-psych/case  [facility] [address], [place] [phone]    Dr. Lolly Mustache  [phone]   [facility]  [employer] [facility] Dept. 1) [address], [place] 2) [address] [place] 3)  [address], [place] [phone]  [phone]   [facility] Child Abuse Hotline [phone] or [phone] (After Hours)

## 2014-10-03 ENCOUNTER — Emergency Department (HOSPITAL_COMMUNITY)
Admission: EM | Admit: 2014-10-03 | Discharge: 2014-10-03 | Disposition: A | Discharge status: Home / Self Care | Payer: Self-pay | Attending: Emergency Medicine | Admitting: Emergency Medicine

## 2014-10-03 ENCOUNTER — Emergency Department (HOSPITAL_COMMUNITY): Payer: Self-pay

## 2014-10-03 ENCOUNTER — Encounter (HOSPITAL_COMMUNITY): Payer: Self-pay | Admitting: Emergency Medicine

## 2014-10-03 DIAGNOSIS — F419 Anxiety disorder, unspecified: Secondary | ICD-10-CM | POA: Insufficient documentation

## 2014-10-03 DIAGNOSIS — R0789 Other chest pain: Secondary | ICD-10-CM | POA: Insufficient documentation

## 2014-10-03 DIAGNOSIS — Z79899 Other long term (current) drug therapy: Secondary | ICD-10-CM | POA: Insufficient documentation

## 2014-10-03 DIAGNOSIS — F1510 Other stimulant abuse, uncomplicated: Secondary | ICD-10-CM | POA: Insufficient documentation

## 2014-10-03 DIAGNOSIS — Z792 Long term (current) use of antibiotics: Secondary | ICD-10-CM | POA: Insufficient documentation

## 2014-10-03 DIAGNOSIS — Z720 Tobacco use: Secondary | ICD-10-CM | POA: Insufficient documentation

## 2014-10-03 DIAGNOSIS — Z21 Asymptomatic human immunodeficiency virus [HIV] infection status: Secondary | ICD-10-CM | POA: Insufficient documentation

## 2014-10-03 LAB — COMPREHENSIVE METABOLIC PANEL
ALT: 20 U/L (ref 0–53)
AST: 37 U/L (ref 0–37)
Albumin: 4.2 g/dL (ref 3.5–5.2)
Alkaline Phosphatase: 67 U/L (ref 39–117)
Anion gap: 10 (ref 5–15)
BUN: 16 mg/dL (ref 6–23)
CO2: 22 mmol/L (ref 19–32)
Calcium: 9.7 mg/dL (ref 8.4–10.5)
Chloride: 104 mmol/L (ref 96–112)
Creatinine, Ser: 1.07 mg/dL (ref 0.50–1.35)
GFR calc Af Amer: >90 mL/min (ref >90)
GFR calc non Af Amer: 90 mL/min — ABNORMAL LOW (ref >90)
Glucose, Bld: 126 mg/dL — ABNORMAL HIGH (ref 70–99)
Potassium: 3.8 mmol/L (ref 3.5–5.1)
Sodium: 136 mmol/L (ref 135–145)
TOTAL PROTEIN: 7.7 g/dL (ref 6.0–8.3)
Total Bilirubin: 0.4 mg/dL (ref 0.3–1.2)

## 2014-10-03 LAB — CBC
HCT: 39.9 % (ref 39.0–52.0)
HEMOGLOBIN: 14.3 g/dL (ref 13.0–17.0)
MCH: 33 pg (ref 26.0–34.0)
MCHC: 35.8 g/dL (ref 30.0–36.0)
MCV: 92.1 fL (ref 78.0–100.0)
Platelets: 224 10*3/uL (ref 150–400)
RBC: 4.33 MIL/uL (ref 4.22–5.81)
RDW: 11.6 % (ref 11.5–15.5)
WBC: 8.9 10*3/uL (ref 4.0–10.5)

## 2014-10-03 LAB — TROPONIN I
TROPONIN I: 0.03 ng/mL (ref <0.031)
Troponin I: <0.03 ng/mL (ref <0.031)

## 2014-10-03 MED ORDER — SODIUM CHLORIDE 0.9 % IV BOLUS (SEPSIS)
1000.0000 mL | Freq: Once | INTRAVENOUS | Status: AC
  Administered 2014-10-03: 1000 mL via INTRAVENOUS

## 2014-10-03 MED ORDER — ASPIRIN 81 MG PO CHEW
324.0000 mg | CHEWABLE_TABLET | Freq: Once | ORAL | Status: AC
  Administered 2014-10-03: 324 mg via ORAL
  Filled 2014-10-03: qty 4

## 2014-10-03 MED ORDER — LORAZEPAM 2 MG/ML IJ SOLN
2.0000 mg | Freq: Once | INTRAMUSCULAR | Status: AC
  Administered 2014-10-03: 2 mg via INTRAVENOUS
  Filled 2014-10-03: qty 1

## 2014-10-03 MED ORDER — LORAZEPAM 1 MG PO TABS: 1.0000 mg | ORAL_TABLET | Freq: Three times a day (TID) | ORAL | Status: DC | PRN

## 2014-10-03 NOTE — Discharge Instructions (Signed)
Please take the medicine prescribed for anxiety and palpitations. All ER results are normal. Come back to the ER if the symptoms get worse.    Stimulant Use Disorder-Methamphetamines Methamphetamines are one of a group of powerful drugs known as stimulants. Methamphetamine is a form of amphetamine. It is rarely used medically but is often misused because of the effects it produces. These effects include:  A feeling of extreme pleasure (euphoria).  Alertness.  High energy.  Increased sexuality. Common street names for methamphetamine are meth, crystal, ice, glass, and chalk. This drug can be taken by mouth, smoked, snorted, or dissolved in water and injected. Stimulants are addictive because they activate regions of the brain that are responsible for producing both the pleasurable sensation of "reward" and psychological dependence. Together, these actions account for loss of control and the rapid development of drug dependence. This means the user will become ill without the drug (withdrawal) and will need to keep using it to function.  Stimulant use disorder is use of stimulants that disrupts your daily life. It disrupts relationships with family and friends and how you do your job. Methamphetamine increases blood pressure and heart rate. Use can cause a heart attack or stroke. Methamphetamine can also cause death from irregular heart rate or seizures. SIGNS AND SYMPTOMS  Signs and symptoms of stimulant use disorder with methamphetamine include:  Use of methamphetamines in larger amounts or over a longer period than intended.  Unsuccessful attempts to cut down or control methamphetamine use.  A lot of time spent obtaining, using, or recovering from the effects of methamphetamines.  A strong desire or urge to use methamphetamines (craving).  Continued use of methamphetamines in spite of major problems at work, school, or home because of use.  Continued use of methamphetamines in spite  of relationship problems because of use.  Giving up or cutting down on important life activities because of use of methamphetamines.  Use of methamphetamines over and over in situations when it is physically hazardous, such as driving a car.  Continued use of methamphetamines in spite of a physical problem that is likely related to use. Physical problems can include:  Extreme weight loss.  Malnutrition.  Jaw clenching.  Severe dental problems (meth mouth).  Lung problems (due to smoking).  Skin sores (due to scratching).  Infections such as human immunodeficiency virus (HIV) and hepatitis (from injecting methamphetamines).  Continued use of methamphetamines in spite of a mental problem that is likely related to use. Mental problems can include:  Memory problems.  Schizophrenia-like symptoms.  Depression.  Bipolar mood swings.  Violent behavior.  Anxiety.  Sleep problems.  Need to use more and more methamphetamines to get the same effect, or lessened effect over time with use of the same amount (tolerance).  Having withdrawal symptoms when methamphetamine use is stopped, or using methamphetamines to reduce or avoid withdrawal symptoms. Withdrawal symptoms include:  Depressed or irritable mood.  Low energy or restlessness.  Bad dreams.  Too little or too much sleep.  Increased appetite. DIAGNOSIS Stimulant use disorder is diagnosed by your health care provider. You may be asked questions about your methamphetamine use and how it affects your life. A physical exam may be done. A drug screen may be ordered. You may be referred to a mental health professional. The diagnosis of stimulant use disorder requires at least two symptoms within 12 months. The type of stimulant use disorder depends on the number of symptoms you have. The type may be:  Mild.  Two or three signs and symptoms.  Moderate. Four or five signs and symptoms.  Severe. Six or more signs and  symptoms. TREATMENT  There are two types of treatment:   Short-term (urgent) medical treatment. This helps to preserve life and prevent or minimize damage from the physical or mental problems related to methamphetamine use.   Long-term substance abuse treatment. This focuses on recovery from use disorder. It is provided by mental health professionals who have training in substance use disorders. It is usually a combination of counseling, support groups, and nonaddictive medicines that can reduce cravings or block the effects of methamphetamine. HOME CARE INSTRUCTIONS   Take medicines only as directed by your health care provider.  Identify the people and activities that trigger your methamphetamine use and avoid them.  Keep all follow-up visits as directed by your health care provider. SEEK MEDICAL CARE IF:  Your symptoms get worse or you relapse.  You are not able to take medicines as directed.  SEEK IMMEDIATE MEDICAL CARE IF:   You have serious thoughts about hurting yourself or others.  You have a seizure, chest pain, sudden weakness, or loss of speech or vision. FOR MORE INFORMATION  National Institute on Drug Abuse: http://www.price-smith.com/  Substance Abuse and Mental Health Services Administration: SkateOasis.com.pt Document Released: 04/25/2004 Document Revised: 01/04/2014 Document Reviewed: 09/02/2013 [facility] Patient Information 2015 [place], [place]. This information is not intended to replace advice given to you by your health care provider. Make sure you discuss any questions you have with your health care provider.

## 2014-10-03 NOTE — ED Provider Notes (Addendum)
CSN: [id number]     Arrival date & time 10/03/14  4098 History   First MD Initiated Contact with Patient 10/03/14 [phone]     Chief Complaint  Patient presents with  . Tachycardia  . Anxiety  . Chest Pain     (Consider location/radiation/quality/duration/timing/severity/associated sxs/prior Treatment) HPI Comments: Pt comes in with cc of chest pain. Pt has hx of methamphetamine abuse, and used some meth 2 hours prior to the ER arrival. Pt comes in with chest pain, palpitations and feeling anxious. He has no SI, HI. Pt denies any other drug use. Pt has no cough, dib. Chest pain is L sided, non radiating. No nausea, diophoresis, numbness, tingling.  The history is provided by the patient.    Past Medical History  Diagnosis Date  . HIV (human immunodeficiency virus infection)    History reviewed. No pertinent past surgical history. History reviewed. No pertinent family history. History  Substance Use Topics  . Smoking status: Current Every Day Smoker    Types: Cigarettes  . Smokeless tobacco: Not on file  . Alcohol Use: No    Review of Systems  Constitutional: Negative for activity change and appetite change.  Eyes: Negative for visual disturbance.  Respiratory: Positive for chest tightness. Negative for cough and shortness of breath.   Cardiovascular: Positive for chest pain.  Gastrointestinal: Negative for abdominal pain.  Genitourinary: Negative for dysuria.  Neurological: Negative for dizziness, syncope, light-headedness and headaches.  All other systems reviewed and are negative.     Allergies  Review of patient's allergies indicates no known allergies.  Home Medications   Prior to Admission medications   Medication Sig Start Date End Date Taking? Authorizing Provider  griseofulvin (GRIFULVIN V) 500 MG tablet Take 1 tablet (500 mg total) by mouth daily. 08/25/14   Shari A Upstill, PA-C  HYDROcodone-acetaminophen (NORCO/VICODIN) 5-325 MG per tablet Take 1-2 tablets  by mouth every 4 (four) hours as needed. 08/25/14   Shari A Upstill, PA-C  penicillin v potassium (VEETID) 500 MG tablet Take 1 tablet (500 mg total) by mouth 3 (three) times daily. 08/25/14   Shari A Upstill, PA-C   BP 146/84 mmHg  Pulse 127  Temp(Src) 99.4 F (37.4 C)  Resp 20  SpO2 98% Physical Exam  Constitutional: He is oriented to person, place, and time. He appears well-developed.  HENT:  Head: Normocephalic and atraumatic.  Eyes: Conjunctivae and EOM are normal. Pupils are equal, round, and reactive to light.  Neck: Normal range of motion. Neck supple.  Cardiovascular: Normal rate, regular rhythm and intact distal pulses.   No murmur heard. Pulmonary/Chest: Effort normal and breath sounds normal. He has no wheezes.  Abdominal: Soft. Bowel sounds are normal. He exhibits no distension. There is no tenderness. There is no rebound and no guarding.  Neurological: He is alert and oriented to person, place, and time.  Skin: Skin is warm.  Nursing note and vitals reviewed.   ED Course  Procedures (including critical care time) Labs Review Labs Reviewed  COMPREHENSIVE METABOLIC PANEL - Abnormal; Notable for the following:    Glucose, Bld 126 (*)    GFR calc non Af Amer 90 (*)    All other components within normal limits  CBC  TROPONIN I  I-STAT TROPOININ, ED    Imaging Review Dg Chest Portable 1 View  10/03/2014   CLINICAL DATA:  Patient used 1 cc of meth 2 hr ago and now is feeling anxious like his heart is racing.  EXAM: PORTABLE  CHEST - 1 VIEW  COMPARISON:  09/07/2014  FINDINGS: Hyperinflation. The heart size and mediastinal contours are within normal limits. Both lungs are clear. The visualized skeletal structures are unremarkable.  IMPRESSION: No active disease.   Electronically Signed   By: Burman NievesWilliam  Stevens M.D.   On: 10/03/2014 04:18     EKG Interpretation   Date/Time:  Sunday October 03 2014 03:38:06 EST Ventricular Rate:  114 PR Interval:  189 QRS Duration:  90 QT Interval:  306 QTC Calculation: 421 R Axis:   83 Text Interpretation:  Sinus tachycardia Probable left atrial enlargement  Abnormal R-wave progression, early transition Borderline T abnormalities,  inferior leads Borderline ST elevation, anterior leads No acute changes  Confirmed by Rhunette CroftNANAVATI, MD, Dahlila Pfahler [phone]([id number]) on 10/03/2014 4:13:43 AM         EKG Interpretation  Date/Time:  Sunday October 03 2014 06:52:19 EST Ventricular Rate:  117 PR Interval:  165 QRS Duration: 94 QT Interval:  278 QTC Calculation: 388 R Axis:   82 Text Interpretation:  Sinus tachycardia Borderline T wave abnormalities No significant change since No ischemic findings Confirmed by Riane Rung, MD, Siyona Coto ([id number]) on 10/03/2014 7:34:32 AM        6:45 AM Chest pain still present, but improved to mild. Also anxiety a lot better. Still tachycardic. Will give another round of ativan and then dispo.  8:26 AM Trops x 2 neg. Will d/c.  MDM   Final diagnoses:  Methamphetamine abuse  Other chest pain    Pt comes in with cc of anxiety. Admit to methamphetamine abuse, and sx onset was after the meth use. Pt will get iv ativan. The EKG shows no acute ischemic findings. Also, pt has intact and equal distal pulse bilaterally, and has no neuro complains - no concerns for dissection.  Will monitor for now.  Derwood KaplanAnkit Monterio Bob, MD 10/03/14 [phone]  Derwood KaplanAnkit Rosebud Koenen, MD 10/03/14 [phone]  Derwood KaplanAnkit Lavene Penagos, MD 10/03/14 [phone]

## 2014-10-03 NOTE — ED Notes (Signed)
Patient used 1cc of Meth 2 hours ago and is now feeling anxious and like his heart is racing. HR is 130, BP 154/102. Denies n/v, diaphoresis or SOB. A/o x4 NAD

## 2014-10-04 ENCOUNTER — Telehealth (HOSPITAL_BASED_OUTPATIENT_CLINIC_OR_DEPARTMENT_OTHER): Payer: Self-pay | Admitting: Emergency Medicine

## 2014-10-31 ENCOUNTER — Emergency Department (HOSPITAL_COMMUNITY)
Admission: EM | Admit: 2014-10-31 | Discharge: 2014-10-31 | Disposition: A | Discharge status: Home / Self Care | Payer: Self-pay | Attending: Emergency Medicine | Admitting: Emergency Medicine

## 2014-10-31 ENCOUNTER — Encounter (HOSPITAL_COMMUNITY): Payer: Self-pay | Admitting: *Deleted

## 2014-10-31 DIAGNOSIS — R002 Palpitations: Secondary | ICD-10-CM | POA: Insufficient documentation

## 2014-10-31 DIAGNOSIS — R0602 Shortness of breath: Secondary | ICD-10-CM | POA: Insufficient documentation

## 2014-10-31 DIAGNOSIS — T43625A Adverse effect of amphetamines, initial encounter: Secondary | ICD-10-CM | POA: Insufficient documentation

## 2014-10-31 DIAGNOSIS — Z720 Tobacco use: Secondary | ICD-10-CM | POA: Insufficient documentation

## 2014-10-31 DIAGNOSIS — Z21 Asymptomatic human immunodeficiency virus [HIV] infection status: Secondary | ICD-10-CM | POA: Insufficient documentation

## 2014-10-31 DIAGNOSIS — Z79899 Other long term (current) drug therapy: Secondary | ICD-10-CM | POA: Insufficient documentation

## 2014-10-31 DIAGNOSIS — F1510 Other stimulant abuse, uncomplicated: Secondary | ICD-10-CM

## 2014-10-31 DIAGNOSIS — R000 Tachycardia, unspecified: Secondary | ICD-10-CM

## 2014-10-31 DIAGNOSIS — R079 Chest pain, unspecified: Secondary | ICD-10-CM | POA: Insufficient documentation

## 2014-10-31 DIAGNOSIS — F419 Anxiety disorder, unspecified: Secondary | ICD-10-CM | POA: Insufficient documentation

## 2014-10-31 LAB — I-STAT CHEM 8, ED
BUN: 12 mg/dL (ref 6–23)
CREATININE: 0.9 mg/dL (ref 0.50–1.35)
Calcium, Ion: 1.12 mmol/L (ref 1.12–1.23)
Chloride: 103 mmol/L (ref 96–112)
Glucose, Bld: 100 mg/dL — ABNORMAL HIGH (ref 70–99)
HCT: 43 % (ref 39.0–52.0)
Hemoglobin: 14.6 g/dL (ref 13.0–17.0)
POTASSIUM: 3.3 mmol/L — AB (ref 3.5–5.1)
SODIUM: 139 mmol/L (ref 135–145)
TCO2: 18 mmol/L (ref 0–100)

## 2014-10-31 LAB — CBC WITH DIFFERENTIAL/PLATELET
BASOS ABS: 0 10*3/uL (ref 0.0–0.1)
Basophils Relative: 0 % (ref 0–1)
EOS ABS: 0 10*3/uL (ref 0.0–0.7)
Eosinophils Relative: 0 % (ref 0–5)
HCT: 38.3 % — ABNORMAL LOW (ref 39.0–52.0)
Hemoglobin: 13.6 g/dL (ref 13.0–17.0)
Lymphocytes Relative: 11 % — ABNORMAL LOW (ref 12–46)
Lymphs Abs: 1.1 10*3/uL (ref 0.7–4.0)
MCH: 32.9 pg (ref 26.0–34.0)
MCHC: 35.5 g/dL (ref 30.0–36.0)
MCV: 92.7 fL (ref 78.0–100.0)
Monocytes Absolute: 0.5 10*3/uL (ref 0.1–1.0)
Monocytes Relative: 5 % (ref 3–12)
Neutro Abs: 8.6 10*3/uL — ABNORMAL HIGH (ref 1.7–7.7)
Neutrophils Relative %: 84 % — ABNORMAL HIGH (ref 43–77)
Platelets: 221 10*3/uL (ref 150–400)
RBC: 4.13 MIL/uL — AB (ref 4.22–5.81)
RDW: 11.4 % — ABNORMAL LOW (ref 11.5–15.5)
WBC: 10.3 10*3/uL (ref 4.0–10.5)

## 2014-10-31 LAB — I-STAT TROPONIN, ED: Troponin i, poc: 0.01 ng/mL (ref 0.00–0.08)

## 2014-10-31 MED ORDER — POTASSIUM CHLORIDE CRYS ER 20 MEQ PO TBCR
40.0000 meq | EXTENDED_RELEASE_TABLET | Freq: Once | ORAL | Status: AC
  Administered 2014-10-31: 40 meq via ORAL
  Filled 2014-10-31: qty 2

## 2014-10-31 MED ORDER — SODIUM CHLORIDE 0.9 % IV BOLUS (SEPSIS)
2000.0000 mL | Freq: Once | INTRAVENOUS | Status: AC
  Administered 2014-10-31: 2000 mL via INTRAVENOUS

## 2014-10-31 MED ORDER — LORAZEPAM 2 MG/ML IJ SOLN
1.0000 mg | Freq: Once | INTRAMUSCULAR | Status: AC
  Administered 2014-10-31: 1 mg via INTRAVENOUS
  Filled 2014-10-31: qty 1

## 2014-10-31 NOTE — ED Notes (Signed)
Patient unable to tell how much Meth he used.  Stated this has happened to him in the past with his heart racing after using.

## 2014-10-31 NOTE — Discharge Instructions (Signed)
STOP USING METH!  Methamphetamines, cocaine, and other stimulants cause damage to your heart.      Stimulant Use Disorder-Methamphetamines Methamphetamines are one of a group of powerful drugs known as stimulants. Methamphetamine is a form of amphetamine. It is rarely used medically but is often misused because of the effects it produces. These effects include:  A feeling of extreme pleasure (euphoria).  Alertness.  High energy.  Increased sexuality. Common street names for methamphetamine are meth, crystal, ice, glass, and chalk. This drug can be taken by mouth, smoked, snorted, or dissolved in water and injected. Stimulants are addictive because they activate regions of the brain that are responsible for producing both the pleasurable sensation of "reward" and psychological dependence. Together, these actions account for loss of control and the rapid development of drug dependence. This means the user will become ill without the drug (withdrawal) and will need to keep using it to function.  Stimulant use disorder is use of stimulants that disrupts your daily life. It disrupts relationships with family and friends and how you do your job. Methamphetamine increases blood pressure and heart rate. Use can cause a heart attack or stroke. Methamphetamine can also cause death from irregular heart rate or seizures. SIGNS AND SYMPTOMS  Signs and symptoms of stimulant use disorder with methamphetamine include:  Use of methamphetamines in larger amounts or over a longer period than intended.  Unsuccessful attempts to cut down or control methamphetamine use.  A lot of time spent obtaining, using, or recovering from the effects of methamphetamines.  A strong desire or urge to use methamphetamines (craving).  Continued use of methamphetamines in spite of major problems at work, school, or home because of use.  Continued use of methamphetamines in spite of relationship problems because of  use.  Giving up or cutting down on important life activities because of use of methamphetamines.  Use of methamphetamines over and over in situations when it is physically hazardous, such as driving a car.  Continued use of methamphetamines in spite of a physical problem that is likely related to use. Physical problems can include:  Extreme weight loss.  Malnutrition.  Jaw clenching.  Severe dental problems (meth mouth).  Lung problems (due to smoking).  Skin sores (due to scratching).  Infections such as human immunodeficiency virus (HIV) and hepatitis (from injecting methamphetamines).  Continued use of methamphetamines in spite of a mental problem that is likely related to use. Mental problems can include:  Memory problems.  Schizophrenia-like symptoms.  Depression.  Bipolar mood swings.  Violent behavior.  Anxiety.  Sleep problems.  Need to use more and more methamphetamines to get the same effect, or lessened effect over time with use of the same amount (tolerance).  Having withdrawal symptoms when methamphetamine use is stopped, or using methamphetamines to reduce or avoid withdrawal symptoms. Withdrawal symptoms include:  Depressed or irritable mood.  Low energy or restlessness.  Bad dreams.  Too little or too much sleep.  Increased appetite. DIAGNOSIS Stimulant use disorder is diagnosed by your health care provider. You may be asked questions about your methamphetamine use and how it affects your life. A physical exam may be done. A drug screen may be ordered. You may be referred to a mental health professional. The diagnosis of stimulant use disorder requires at least two symptoms within 12 months. The type of stimulant use disorder depends on the number of symptoms you have. The type may be:  Mild. Two or three signs and symptoms.  Moderate.  Four or five signs and symptoms.  Severe. Six or more signs and symptoms. TREATMENT  There are two types of  treatment:   Short-term (urgent) medical treatment. This helps to preserve life and prevent or minimize damage from the physical or mental problems related to methamphetamine use.   Long-term substance abuse treatment. This focuses on recovery from use disorder. It is provided by mental health professionals who have training in substance use disorders. It is usually a combination of counseling, support groups, and nonaddictive medicines that can reduce cravings or block the effects of methamphetamine. HOME CARE INSTRUCTIONS   Take medicines only as directed by your health care provider.  Identify the people and activities that trigger your methamphetamine use and avoid them.  Keep all follow-up visits as directed by your health care provider. SEEK MEDICAL CARE IF:  Your symptoms get worse or you relapse.  You are not able to take medicines as directed.  SEEK IMMEDIATE MEDICAL CARE IF:   You have serious thoughts about hurting yourself or others.  You have a seizure, chest pain, sudden weakness, or loss of speech or vision. FOR MORE INFORMATION  National Institute on Drug Abuse: http://www.price-smith.com/  Substance Abuse and Mental Health Services Administration: SkateOasis.com.pt Document Released: 04/25/2004 Document Revised: 01/04/2014 Document Reviewed: 09/02/2013 [facility] Patient Information 2015 [place], [place]. This information is not intended to replace advice given to you by your health care provider. Make sure you discuss any questions you have with your health care provider.

## 2014-10-31 NOTE — ED Notes (Signed)
Patient presents stating about 2 hours ago he did some Meth and now he feels like his heart is racing.  Patient very anxious while in triage

## 2014-10-31 NOTE — ED Notes (Signed)
MD at bedside. 

## 2014-10-31 NOTE — ED Provider Notes (Signed)
CSN: [id number]     Arrival date & time 10/31/14  0244 History  This chart was scribed for Olivia Mackie, MD by Karle Plumber, ED Scribe. This patient was seen in room B15C/B15C and the patient's care was started at 3:56 AM.  Chief Complaint  Patient presents with  . Medication Reaction   The history is provided by the patient and medical records. No language interpreter was used.    HPI Comments:  Douglas Dunn is a 34 y.o. male with PMHx of HIV and substance abuse who presents to the Emergency Department complaining of heart palpitations, CP and SOB that began after injecting Meth about 2-3 hours ago. Pt states he is aware of the harm he is doing to his heart by using drugs. He reports he does not take the Meth to get high, but does it for sexual side effects. He reports he takes daily medication for his HIV. He denies any other issues at this time.   Past Medical History  Diagnosis Date  . HIV (human immunodeficiency virus infection)    History reviewed. No pertinent past surgical history. No family history on file. History  Substance Use Topics  . Smoking status: Current Every Day Smoker    Types: Cigarettes  . Smokeless tobacco: Never Used  . Alcohol Use: No    Review of Systems  All other systems reviewed and are negative.   Allergies  Review of patient's allergies indicates no known allergies.  Home Medications   Prior to Admission medications   Medication Sig Start Date End Date Taking? Authorizing Provider  elvitegravir-cobicistat-emtricitabine-tenofovir (STRIBILD) 150-150-200-300 MG TABS tablet Take 1 tablet by mouth daily with breakfast.   Yes Historical Provider, MD  griseofulvin (GRIFULVIN V) 500 MG tablet Take 1 tablet (500 mg total) by mouth daily. Patient not taking: Reported on 10/03/2014 08/25/14   Melvenia Beam A Upstill, PA-C  HYDROcodone-acetaminophen (NORCO/VICODIN) 5-325 MG per tablet Take 1-2 tablets by mouth every 4 (four) hours as needed. Patient not taking:  Reported on 10/03/2014 08/25/14   Melvenia Beam A Upstill, PA-C  LORazepam (ATIVAN) 1 MG tablet Take 1 tablet (1 mg total) by mouth 3 (three) times daily as needed for anxiety. Patient not taking: Reported on 10/31/2014 10/03/14   Derwood Kaplan, MD  penicillin v potassium (VEETID) 500 MG tablet Take 1 tablet (500 mg total) by mouth 3 (three) times daily. Patient not taking: Reported on 10/03/2014 08/25/14   Arnoldo Hooker, PA-C   Triage Vitals: BP 143/82 mmHg  Pulse 141  Temp(Src) 97.7 F (36.5 C) (Oral)  Resp 20  Ht  (1.854 m)  Wt 180 lb (81.647 kg)  BMI 23.75 kg/m2  SpO2 100% Physical Exam  Constitutional: He is oriented to person, place, and time. He appears well-developed and well-nourished. He appears distressed.  HENT:  Head: Normocephalic and atraumatic.  Right Ear: External ear normal.  Left Ear: External ear normal.  Nose: Nose normal.  Mouth/Throat: Oropharynx is clear and moist.  Eyes: Conjunctivae and EOM are normal. Pupils are equal, round, and reactive to light.  Neck: Normal range of motion. Neck supple. No JVD present. No tracheal deviation present. No thyromegaly present.  Cardiovascular: Normal rate, regular rhythm, normal heart sounds and intact distal pulses.  Exam reveals no gallop and no friction rub.   No murmur heard. Pulmonary/Chest: Effort normal and breath sounds normal. No stridor. No respiratory distress. He has no wheezes. He has no rales. He exhibits no tenderness.  Abdominal: Soft. Bowel sounds are  normal. He exhibits no distension and no mass. There is no tenderness. There is no rebound and no guarding.  Musculoskeletal: Normal range of motion. He exhibits no edema or tenderness.  Lymphadenopathy:    He has no cervical adenopathy.  Neurological: He is alert and oriented to person, place, and time. He displays normal reflexes. He exhibits normal muscle tone. Coordination normal.  Skin: Skin is warm and dry. No rash noted. No erythema. No pallor.   Psychiatric:  Anxious, poor insight and judgment  Nursing note and vitals reviewed.   ED Course  Procedures (including critical care time) DIAGNOSTIC STUDIES: Oxygen Saturation is 100% on RA, normal by my interpretation.   COORDINATION OF CARE: 3:59 AM- Will order IV fluids and Ativan. Pt verbalizes understanding and agrees to plan.  Medications  sodium chloride 0.9 % bolus 2,000 mL (not administered)  LORazepam (ATIVAN) injection 1 mg (not administered)    Labs Review Labs Reviewed  CBC WITH DIFFERENTIAL/PLATELET - Abnormal; Notable for the following:    RBC 4.13 (*)    HCT 38.3 (*)    RDW 11.4 (*)    Neutrophils Relative % 84 (*)    Neutro Abs 8.6 (*)    Lymphocytes Relative 11 (*)    All other components within normal limits  I-STAT CHEM 8, ED - Abnormal; Notable for the following:    Potassium 3.3 (*)    Glucose, Bld 100 (*)    All other components within normal limits  I-STAT TROPOININ, ED    Imaging Review No results found.   EKG Interpretation   Date/Time:  Sunday October 31 2014 02:48:33 EST Ventricular Rate:  137 PR Interval:  160 QRS Duration: 90 QT Interval:  272 QTC Calculation: 410 R Axis:   85 Text Interpretation:  Sinus tachycardia Otherwise normal ECG No  significant change since last tracing Confirmed by Amberlea Spagnuolo  MD, Leslieanne Cobarrubias ([id number])  on 10/31/2014 4:18:01 AM      MDM   Final diagnoses:  Methamphetamine abuse  Sinus tachycardia   [age] year old male with palpitations, shortness of breath and chest pain after injecting methamphetamines tonight.  Patient has history of same.  EKG was sinus tachycardia without ischemic changes.  Plan for IV fluids and Ativan.  Expect patient will be able to be discharged home.  I personally performed the services described in this documentation, which was scribed in my presence. The recorded information has been reviewed and is accurate.    Olivia Mackielga M Everlean Bucher, MD 10/31/14 [phone]

## 2015-01-30 ENCOUNTER — Emergency Department (HOSPITAL_COMMUNITY)
Admission: EM | Admit: 2015-01-30 | Discharge: 2015-01-30 | Disposition: A | Discharge status: Home / Self Care | Payer: BLUE CROSS/BLUE SHIELD | Attending: Emergency Medicine | Admitting: Emergency Medicine

## 2015-01-30 ENCOUNTER — Encounter (HOSPITAL_COMMUNITY): Payer: Self-pay | Admitting: Family Medicine

## 2015-01-30 DIAGNOSIS — Z792 Long term (current) use of antibiotics: Secondary | ICD-10-CM | POA: Insufficient documentation

## 2015-01-30 DIAGNOSIS — F1910 Other psychoactive substance abuse, uncomplicated: Secondary | ICD-10-CM

## 2015-01-30 DIAGNOSIS — F1510 Other stimulant abuse, uncomplicated: Secondary | ICD-10-CM | POA: Insufficient documentation

## 2015-01-30 DIAGNOSIS — F419 Anxiety disorder, unspecified: Secondary | ICD-10-CM | POA: Diagnosis not present

## 2015-01-30 DIAGNOSIS — B20 Human immunodeficiency virus [HIV] disease: Secondary | ICD-10-CM | POA: Diagnosis not present

## 2015-01-30 DIAGNOSIS — R0602 Shortness of breath: Secondary | ICD-10-CM | POA: Diagnosis not present

## 2015-01-30 DIAGNOSIS — R000 Tachycardia, unspecified: Secondary | ICD-10-CM | POA: Diagnosis not present

## 2015-01-30 DIAGNOSIS — Z79899 Other long term (current) drug therapy: Secondary | ICD-10-CM | POA: Diagnosis not present

## 2015-01-30 DIAGNOSIS — Z720 Tobacco use: Secondary | ICD-10-CM | POA: Diagnosis not present

## 2015-01-30 LAB — CBC
HCT: 40.2 % (ref 39.0–52.0)
Hemoglobin: 14.3 g/dL (ref 13.0–17.0)
MCH: 32.5 pg (ref 26.0–34.0)
MCHC: 35.6 g/dL (ref 30.0–36.0)
MCV: 91.4 fL (ref 78.0–100.0)
PLATELETS: 256 10*3/uL (ref 150–400)
RBC: 4.4 MIL/uL (ref 4.22–5.81)
RDW: 11.5 % (ref 11.5–15.5)
WBC: 8.5 10*3/uL (ref 4.0–10.5)

## 2015-01-30 LAB — BASIC METABOLIC PANEL
Anion gap: 12 (ref 5–15)
BUN: 10 mg/dL (ref 6–20)
CO2: 23 mmol/L (ref 22–32)
Calcium: 9.8 mg/dL (ref 8.9–10.3)
Chloride: 103 mmol/L (ref 101–111)
Creatinine, Ser: 1.09 mg/dL (ref 0.61–1.24)
GFR calc Af Amer: >60 mL/min (ref >60)
GFR calc non Af Amer: >60 mL/min (ref >60)
Glucose, Bld: 128 mg/dL — ABNORMAL HIGH (ref 65–99)
Potassium: 3.2 mmol/L — ABNORMAL LOW (ref 3.5–5.1)
Sodium: 138 mmol/L (ref 135–145)

## 2015-01-30 LAB — TROPONIN I: Troponin I: <0.03 ng/mL (ref <0.031)

## 2015-01-30 MED ORDER — LORAZEPAM 2 MG/ML IJ SOLN
1.0000 mg | INTRAMUSCULAR | Status: DC | PRN
  Administered 2015-01-30: 1 mg via INTRAVENOUS
  Filled 2015-01-30: qty 1

## 2015-01-30 MED ORDER — LORAZEPAM 2 MG/ML IJ SOLN
2.0000 mg | Freq: Once | INTRAMUSCULAR | Status: AC
  Administered 2015-01-30: 2 mg via INTRAVENOUS
  Filled 2015-01-30: qty 1

## 2015-01-30 MED ORDER — SODIUM CHLORIDE 0.9 % IV BOLUS (SEPSIS)
1000.0000 mL | Freq: Once | INTRAVENOUS | Status: AC
  Administered 2015-01-30: 1000 mL via INTRAVENOUS

## 2015-01-30 MED ORDER — LORAZEPAM 1 MG PO TABS: 1.0000 mg | ORAL_TABLET | ORAL | Status: DC | PRN

## 2015-01-30 NOTE — ED Notes (Signed)
Pt here for tachycardia after taking meth about 1 hour ago. sts some chest pain and SOB.

## 2015-01-30 NOTE — ED Notes (Addendum)
Pt reports change and increase in pain in chest. Pt states that pain is "crushing my left side". Repeat ekg done and given to dr Fayrene Fearingjames.

## 2015-01-30 NOTE — ED Provider Notes (Signed)
CSN: [id number]     Arrival date & time 01/30/15  1304 History   First MD Initiated Contact with Patient 01/30/15 1320     Chief Complaint  Patient presents with  . Tachycardia  . Drug Problem      HPI  Patient presents for evaluation of rapid heart rate.  Multiple recent past similar episodes for the exact same presentation and situation.  Patient states that he does M phentermine so because it "makes the sex better". States he frequently gets tachycardic and anxious" especially after it had sex". Presents here. Use of crystal meth approximate 5 or 6 hours ago. States he continues to feel the effects of the rapid heart rate ends presents here. Denies back pain. Denies extremity weakness. No stroke symptoms numbness or weakness.  Past Medical History  Diagnosis Date  . HIV (human immunodeficiency virus infection)    History reviewed. No pertinent past surgical history. History reviewed. No pertinent family history. History  Substance Use Topics  . Smoking status: Current Every Day Smoker    Types: Cigarettes  . Smokeless tobacco: Never Used  . Alcohol Use: No    Review of Systems  Constitutional: Negative for fever, chills, diaphoresis, appetite change and fatigue.  HENT: Negative for mouth sores, sore throat and trouble swallowing.   Eyes: Negative for visual disturbance.  Respiratory: Positive for shortness of breath. Negative for cough, chest tightness and wheezing.   Cardiovascular: Positive for palpitations. Negative for chest pain.  Gastrointestinal: Negative for nausea, vomiting, abdominal pain, diarrhea and abdominal distention.  Endocrine: Negative for polydipsia, polyphagia and polyuria.  Genitourinary: Negative for dysuria, frequency and hematuria.  Musculoskeletal: Negative for gait problem.  Skin: Negative for color change, pallor and rash.  Neurological: Negative for dizziness, syncope, light-headedness and headaches.  Hematological: Does not bruise/bleed  easily.  Psychiatric/Behavioral: Negative for behavioral problems and confusion. The patient is nervous/anxious.       Allergies  Review of patient's allergies indicates no known allergies.  Home Medications   Prior to Admission medications   Medication Sig Start Date End Date Taking? Authorizing Provider  elvitegravir-cobicistat-emtricitabine-tenofovir (STRIBILD) 150-150-200-300 MG TABS tablet Take 1 tablet by mouth daily with breakfast.   Yes Historical Provider, MD  HYDROcodone-acetaminophen (NORCO/VICODIN) 5-325 MG per tablet Take 1-2 tablets by mouth every 4 (four) hours as needed. 08/25/14  Yes Shari Upstill, PA-C  LORazepam (ATIVAN) 1 MG tablet Take 1 tablet (1 mg total) by mouth 3 (three) times daily as needed for anxiety. 10/03/14  Yes Derwood KaplanAnkit Nanavati, MD  griseofulvin (GRIFULVIN V) 500 MG tablet Take 1 tablet (500 mg total) by mouth daily. Patient not taking: Reported on 10/03/2014 08/25/14   Elpidio AnisShari Upstill, PA-C  penicillin v potassium (VEETID) 500 MG tablet Take 1 tablet (500 mg total) by mouth 3 (three) times daily. Patient not taking: Reported on 10/03/2014 08/25/14   Elpidio AnisShari Upstill, PA-C   BP 137/110 mmHg  Pulse 123  Temp(Src) 97.9 F (36.6 C) (Oral)  Resp 15  SpO2 100% Physical Exam  Constitutional: He is oriented to person, place, and time. He appears well-developed and well-nourished. No distress.  HENT:  Head: Normocephalic.  Eyes: Conjunctivae are normal. Pupils are equal, round, and reactive to light. No scleral icterus.  Neck: Normal range of motion. Neck supple. No thyromegaly present.  Cardiovascular: Regular rhythm.  Tachycardia present.  Exam reveals no gallop and no friction rub.   No murmur heard. Tachycardic. Sinus rhythm on the monitor. Symmetric pulses to the upper extremities. No murmurs.  Pulmonary/Chest: Effort normal and breath sounds normal. No respiratory distress. He has no wheezes. He has no rales.  There are bilateral breath sounds. No wheezing  rales rhonchi or prolongation.  Abdominal: Soft. Bowel sounds are normal. He exhibits no distension. There is no tenderness. There is no rebound.  Musculoskeletal: Normal range of motion.  Neurological: He is alert and oriented to person, place, and time.  Intact symmetric cranial nerves. Normal strength and sedation of the 4 extremities.  Skin: Skin is warm and dry. No rash noted.  Psychiatric: He has a normal mood and affect. His behavior is normal.    ED Course  Procedures (including critical care time) Labs Review Labs Reviewed  BASIC METABOLIC PANEL - Abnormal; Notable for the following:    Potassium 3.2 (*)    Glucose, Bld 128 (*)    All other components within normal limits  CBC  TROPONIN I    Imaging Review No results found.   EKG Interpretation None      MDM   Final diagnoses:  Drug abuse  Tachycardia    Patient continues to have tachycardia however it is improved. Most recent rate 109. Did get relief with IV Ativan. 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 No ischemia. No pain simply anxiety and palpitations. He states that he uses d-amphetamine because "it makes the sex so good". I did implore with him that since he is HIV-positive to please use a condom.    Rolland Porter, MD 01/30/15 1450

## 2015-01-30 NOTE — Discharge Instructions (Signed)
Methamphetamine abuse causes anxiety, chest pain, and rapid heartbeat.  These symptoms will continue to happen if you continue to abuse methamphetamines.

## 2015-03-07 ENCOUNTER — Encounter (HOSPITAL_COMMUNITY): Payer: Self-pay | Admitting: Physical Medicine and Rehabilitation

## 2015-03-07 ENCOUNTER — Emergency Department (HOSPITAL_COMMUNITY)
Admission: EM | Admit: 2015-03-07 | Discharge: 2015-03-07 | Disposition: A | Discharge status: Home / Self Care | Payer: BLUE CROSS/BLUE SHIELD | Attending: Emergency Medicine | Admitting: Emergency Medicine

## 2015-03-07 DIAGNOSIS — Z79899 Other long term (current) drug therapy: Secondary | ICD-10-CM | POA: Diagnosis not present

## 2015-03-07 DIAGNOSIS — F1510 Other stimulant abuse, uncomplicated: Secondary | ICD-10-CM | POA: Diagnosis not present

## 2015-03-07 DIAGNOSIS — Z21 Asymptomatic human immunodeficiency virus [HIV] infection status: Secondary | ICD-10-CM | POA: Insufficient documentation

## 2015-03-07 DIAGNOSIS — F419 Anxiety disorder, unspecified: Secondary | ICD-10-CM | POA: Diagnosis not present

## 2015-03-07 DIAGNOSIS — R002 Palpitations: Secondary | ICD-10-CM | POA: Diagnosis present

## 2015-03-07 DIAGNOSIS — Z720 Tobacco use: Secondary | ICD-10-CM | POA: Insufficient documentation

## 2015-03-07 DIAGNOSIS — R000 Tachycardia, unspecified: Secondary | ICD-10-CM | POA: Diagnosis not present

## 2015-03-07 LAB — I-STAT CHEM 8, ED
BUN: 9 mg/dL (ref 6–20)
CHLORIDE: 103 mmol/L (ref 101–111)
CREATININE: 1 mg/dL (ref 0.61–1.24)
Calcium, Ion: 1.19 mmol/L (ref 1.12–1.23)
Glucose, Bld: 94 mg/dL (ref 65–99)
HCT: 42 % (ref 39.0–52.0)
Hemoglobin: 14.3 g/dL (ref 13.0–17.0)
POTASSIUM: 3 mmol/L — AB (ref 3.5–5.1)
Sodium: 140 mmol/L (ref 135–145)
TCO2: 22 mmol/L (ref 0–100)

## 2015-03-07 MED ORDER — LORAZEPAM 2 MG/ML IJ SOLN
2.0000 mg | Freq: Once | INTRAMUSCULAR | Status: AC
  Administered 2015-03-07: 2 mg via INTRAVENOUS
  Filled 2015-03-07: qty 1

## 2015-03-07 MED ORDER — SODIUM CHLORIDE 0.9 % IV BOLUS (SEPSIS)
1000.0000 mL | Freq: Once | INTRAVENOUS | Status: AC
  Administered 2015-03-07: 1000 mL via INTRAVENOUS

## 2015-03-07 MED ORDER — SODIUM CHLORIDE 0.9 % IV SOLN
INTRAVENOUS | Status: DC
  Administered 2015-03-07: 125 mL/h via INTRAVENOUS

## 2015-03-07 MED ORDER — POTASSIUM CHLORIDE CRYS ER 20 MEQ PO TBCR
40.0000 meq | EXTENDED_RELEASE_TABLET | Freq: Once | ORAL | Status: AC
Start: 2015-03-07 — End: 2015-03-07
  Administered 2015-03-07: 40 meq via ORAL
  Filled 2015-03-07: qty 2

## 2015-03-07 NOTE — ED Notes (Addendum)
Pt reports recent methaphetamine use this morning. Now reports rapid heart rate and chest discomfort. Pt is alert and oriented x4.

## 2015-03-07 NOTE — Discharge Instructions (Signed)
Polysubstance Abuse °When people abuse more than one drug or type of drug it is called polysubstance or polydrug abuse. For example, many smokers also drink alcohol. This is one form of polydrug abuse. Polydrug abuse also refers to the use of a drug to counteract an unpleasant effect produced by another drug. It may also be used to help with withdrawal from another drug. People who take stimulants may become agitated. Sometimes this agitation is countered with a tranquilizer. This helps protect against the unpleasant side effects. Polydrug abuse also refers to the use of different drugs at the same time.  °Anytime drug use is interfering with normal living activities, it has become abuse. This includes problems with family and friends. Psychological dependence has developed when your mind tells you that the drug is needed. This is usually followed by physical dependence which has developed when continuing increases of drug are required to get the same feeling or "high". This is known as addiction or chemical dependency. A person's risk is much higher if there is a history of chemical dependency in the family. °SIGNS OF CHEMICAL DEPENDENCY °· You have been told by friends or family that drugs have become a problem. °· You fight when using drugs. °· You are having blackouts (not remembering what you do while using). °· You feel sick from using drugs but continue using. °· You lie about use or amounts of drugs (chemicals) used. °· You need chemicals to get you going. °· You are suffering in work performance or in school because of drug use. °· You get sick from use of drugs but continue to use anyway. °· You need drugs to relate to people or feel comfortable in social situations. °· You use drugs to forget problems. °"Yes" answered to any of the above signs of chemical dependency indicates there are problems. The longer the use of drugs continues, the greater the problems will become. °If there is a family history of  drug or alcohol use, it is best not to experiment with these drugs. Continual use leads to tolerance. After tolerance develops more of the drug is needed to get the same feeling. This is followed by addiction. With addiction, drugs become the most important part of life. It becomes more important to take drugs than participate in the other usual activities of life. This includes relating to friends and family. Addiction is followed by dependency. Dependency is a condition where drugs are now needed not just to get high, but to feel normal. °Addiction cannot be cured but it can be stopped. This often requires outside help and the care of professionals. Treatment centers are listed in the yellow pages under: Cocaine, Narcotics, and Alcoholics Anonymous. Most hospitals and clinics can refer you to a specialized care center. Talk to your caregiver if you need help. °Document Released: 04/11/2005 Document Revised: 11/12/2011 Document Reviewed: 08/20/2005 °Exit[employer]® Patient Information ©2015 [employer]. This information is not intended to replace advice given to you by your health care provider. Make sure you discuss any questions you have with your health care provider. ° °

## 2015-03-07 NOTE — ED Provider Notes (Signed)
CSN: [id number]     Arrival date & time 03/07/15  1026 History   First MD Initiated Contact with Patient 03/07/15 1030     Chief Complaint  Patient presents with  . Palpitations     (Consider location/radiation/quality/duration/timing/severity/associated sxs/prior Treatment) HPI Comments: Patient complaining of anxiety and palpitations after using methamphetamines prior to arrival. States he feels short of breath with chest tightness. Denies any anginal type symptoms. Denies any other drug use currently. Notes weakness and lightheadedness. Denies any severe headache or neurological findings. No daily use of alcohol. Denies any syncope or near-syncope. Symptoms persistent and nothing makes them better or worse. No tracheal use prior to arrival.  Patient is a 34 y.o. male presenting with palpitations. The history is provided by the patient.  Palpitations   Past Medical History  Diagnosis Date  . HIV (human immunodeficiency virus infection)    History reviewed. No pertinent past surgical history. History reviewed. No pertinent family history. History  Substance Use Topics  . Smoking status: Current Every Day Smoker    Types: Cigarettes  . Smokeless tobacco: Never Used  . Alcohol Use: No    Review of Systems  Cardiovascular: Positive for palpitations.  All other systems reviewed and are negative.     Allergies  Review of patient's allergies indicates no known allergies.  Home Medications   Prior to Admission medications   Medication Sig Start Date End Date Taking? Authorizing Provider  elvitegravir-cobicistat-emtricitabine-tenofovir (STRIBILD) 150-150-200-300 MG TABS tablet Take 1 tablet by mouth daily with breakfast.    Historical Provider, MD  griseofulvin (GRIFULVIN V) 500 MG tablet Take 1 tablet (500 mg total) by mouth daily. Patient not taking: Reported on 10/03/2014 08/25/14   Elpidio Anis, PA-C  HYDROcodone-acetaminophen (NORCO/VICODIN) 5-325 MG per tablet Take 1-2  tablets by mouth every 4 (four) hours as needed. 08/25/14   Elpidio Anis, PA-C  LORazepam (ATIVAN) 1 MG tablet Take 1 tablet (1 mg total) by mouth 3 (three) times daily as needed for anxiety. 10/03/14   Derwood Kaplan, MD  penicillin v potassium (VEETID) 500 MG tablet Take 1 tablet (500 mg total) by mouth 3 (three) times daily. Patient not taking: Reported on 10/03/2014 08/25/14   Elpidio Anis, PA-C   BP 126/84 mmHg  Pulse 133  Temp(Src) 98.7 F (37.1 C) (Oral)  Resp 18  SpO2 99% Physical Exam  Constitutional: He is oriented to person, place, and time. He appears well-developed and well-nourished.  Non-toxic appearance. No distress.  HENT:  Head: Normocephalic and atraumatic.  Eyes: Conjunctivae, EOM and lids are normal. Pupils are equal, round, and reactive to light.  Neck: Normal range of motion. Neck supple. No tracheal deviation present. No thyroid mass present.  Cardiovascular: Regular rhythm and normal heart sounds.  Tachycardia present.  Exam reveals no gallop.   No murmur heard. Pulmonary/Chest: Effort normal and breath sounds normal. No stridor. No respiratory distress. He has no decreased breath sounds. He has no wheezes. He has no rhonchi. He has no rales.  Abdominal: Soft. Normal appearance and bowel sounds are normal. He exhibits no distension. There is no tenderness. There is no rebound and no CVA tenderness.  Musculoskeletal: Normal range of motion. He exhibits no edema or tenderness.  Neurological: He is alert and oriented to person, place, and time. He has normal strength. No cranial nerve deficit or sensory deficit. GCS eye subscore is 4. GCS verbal subscore is 5. GCS motor subscore is 6.  Skin: Skin is warm and dry. No abrasion and  no rash noted.  Psychiatric: His speech is normal and behavior is normal. His mood appears anxious.  Nursing note and vitals reviewed.   ED Course  Procedures (including critical care time) Labs Review Labs Reviewed  I-STAT CHEM 8, ED     Imaging Review No results found.  ED ECG REPORT   Date: 03/07/2015  Rate: 130  Rhythm: sinus tachycardia  QRS Axis: normal  Intervals: normal  ST/T Wave abnormalities: nonspecific ST changes  Conduction Disutrbances:none  Narrative Interpretation:   Old EKG Reviewed: none available  I have personally reviewed the EKG tracing and agree with the computerized printout as noted.   Final diagnoses:  None    Patient given Ativan IV fluids. Heart rate has improved. Blood pressure stable. He'll be discharged home    Lorre NickAnthony Eyob Godlewski, MD 03/07/15 1428

## 2015-04-02 ENCOUNTER — Other Ambulatory Visit: Payer: Self-pay

## 2015-04-03 ENCOUNTER — Encounter (HOSPITAL_COMMUNITY): Payer: Self-pay | Admitting: *Deleted

## 2015-04-03 ENCOUNTER — Emergency Department (HOSPITAL_COMMUNITY)
Admission: EM | Admit: 2015-04-03 | Discharge: 2015-04-03 | Disposition: A | Discharge status: Home / Self Care | Payer: BLUE CROSS/BLUE SHIELD | Attending: Emergency Medicine | Admitting: Emergency Medicine

## 2015-04-03 DIAGNOSIS — F1910 Other psychoactive substance abuse, uncomplicated: Secondary | ICD-10-CM

## 2015-04-03 DIAGNOSIS — Z720 Tobacco use: Secondary | ICD-10-CM | POA: Diagnosis not present

## 2015-04-03 DIAGNOSIS — F1510 Other stimulant abuse, uncomplicated: Secondary | ICD-10-CM | POA: Insufficient documentation

## 2015-04-03 DIAGNOSIS — Z21 Asymptomatic human immunodeficiency virus [HIV] infection status: Secondary | ICD-10-CM | POA: Insufficient documentation

## 2015-04-03 DIAGNOSIS — Z79899 Other long term (current) drug therapy: Secondary | ICD-10-CM | POA: Diagnosis not present

## 2015-04-03 DIAGNOSIS — R000 Tachycardia, unspecified: Secondary | ICD-10-CM | POA: Insufficient documentation

## 2015-04-03 MED ORDER — LORAZEPAM 1 MG PO TABS
1.0000 mg | ORAL_TABLET | Freq: Once | ORAL | Status: AC
  Administered 2015-04-03: 1 mg via ORAL
  Filled 2015-04-03: qty 1

## 2015-04-03 NOTE — ED Notes (Signed)
Anxiety attack vs heart attack  Treatment center last week to be enrolled. Used IV Crystal Meth this am Heart racing

## 2015-04-03 NOTE — ED Notes (Signed)
Awake. Verbally responsive. A/O x4. Resp even and unlabored. No audible adventitious breath sounds noted. ABC's intact. ST on monitor. Pt reported that the Ativan helped.

## 2015-04-03 NOTE — ED Notes (Signed)
Sharlet Salina, PA at bedside. Pt reported to continue to feel anxious.

## 2015-04-03 NOTE — ED Provider Notes (Signed)
CSN: [id number]     Arrival date & time 04/03/15  1221 History   First MD Initiated Contact with Patient 04/03/15 1241     Chief Complaint  Patient presents with  . Tachycardia     (Consider location/radiation/quality/duration/timing/severity/associated sxs/prior Treatment) HPI Douglas Dunn is a 34 y.o. male with history of HIV and methamphetamine abuse comes in for evaluation of fast heart rate. Patient states that he has been using methamphetamine, most recently today, felt anxious after doing the drug and was unsure if he was having a heart attack and so he called EMS. EMS recommended he come to the ED for further evaluation. Patient reports feeling much better in the ED. Denies any chest pain, shortness of breath or other difficulty breathing. He reports his anxiety has abated. He also reports that he has enrolled in a substance abuse facility and has obviously relapsed today. Patient has been seen in the ED multiple times for this problem.  Past Medical History  Diagnosis Date  . HIV (human immunodeficiency virus infection)    History reviewed. No pertinent past surgical history. History reviewed. No pertinent family history. History  Substance Use Topics  . Smoking status: Current Every Day Smoker    Types: Cigarettes  . Smokeless tobacco: Never Used  . Alcohol Use: No    Review of Systems A 10 point review of systems was completed and was negative except for pertinent positives and negatives as mentioned in the history of present illness     Allergies  Review of patient's allergies indicates no known allergies.  Home Medications   Prior to Admission medications   Medication Sig Start Date End Date Taking? Authorizing Provider  elvitegravir-cobicistat-emtricitabine-tenofovir (STRIBILD) 150-150-200-300 MG TABS tablet Take 1 tablet by mouth daily with breakfast.   Yes Historical Provider, MD  minocycline (MINOCIN,DYNACIN) 100 MG capsule Take 1 capsule by mouth daily.  03/10/15  Yes Historical Provider, MD  griseofulvin (GRIFULVIN V) 500 MG tablet Take 1 tablet (500 mg total) by mouth daily. Patient not taking: Reported on 10/03/2014 08/25/14   Elpidio Anis, PA-C  HYDROcodone-acetaminophen (NORCO/VICODIN) 5-325 MG per tablet Take 1-2 tablets by mouth every 4 (four) hours as needed. Patient not taking: Reported on 03/07/2015 08/25/14   Elpidio Anis, PA-C  LORazepam (ATIVAN) 1 MG tablet Take 1 tablet (1 mg total) by mouth 3 (three) times daily as needed for anxiety. Patient not taking: Reported on 03/07/2015 10/03/14   Derwood Kaplan, MD  penicillin v potassium (VEETID) 500 MG tablet Take 1 tablet (500 mg total) by mouth 3 (three) times daily. Patient not taking: Reported on 10/03/2014 08/25/14   Elpidio Anis, PA-C   BP 127/76 mmHg  Pulse 110  Temp(Src) 98 F (36.7 C) (Oral)  Resp 20  SpO2 100% Physical Exam  Constitutional: He is oriented to person, place, and time. He appears well-developed and well-nourished.  HENT:  Head: Normocephalic and atraumatic.  Mouth/Throat: Oropharynx is clear and moist.  Eyes: Conjunctivae are normal. Pupils are equal, round, and reactive to light. Right eye exhibits no discharge. Left eye exhibits no discharge. No scleral icterus.  Neck: Neck supple.  Cardiovascular: Regular rhythm and normal heart sounds.   Tachycardia with no murmurs, rubs or gallops.  Pulmonary/Chest: Effort normal and breath sounds normal. No respiratory distress. He has no wheezes. He has no rales.  Abdominal: Soft. There is no tenderness.  Musculoskeletal: He exhibits no tenderness.  Neurological: He is alert and oriented to person, place, and time.  Cranial Nerves II-XII grossly  intact  Skin: Skin is warm and dry. No rash noted.  Psychiatric: He has a normal mood and affect.  Nursing note and vitals reviewed.   ED Course  Procedures (including critical care time) Labs Review Labs Reviewed - No data to display  Imaging Review No results  found.   EKG Interpretation None     ED ECG REPORT   Date: 04/03/2015  Rate: 107  Rhythm: sinus tachycardia  QRS Axis: normal  Intervals: normal  ST/T Wave abnormalities: nonspecific T wave changes  Conduction Disutrbances:none  Narrative Interpretation: Sinus tachycardia with nonspecific T wave changes unchanged from prior.  Old EKG Reviewed: unchanged  I have personally reviewed the EKG tracing and agree with the computerized printout as noted.  Meds given in ED:  Medications  LORazepam (ATIVAN) tablet 1 mg (1 mg Oral Given 04/03/15 1331)    New Prescriptions   No medications on file   Filed Vitals:   04/03/15 1330 04/03/15 1400 04/03/15 1430 04/03/15 1500  BP: 133/80 134/80 129/88 127/76  Pulse: 109 101 106 110  Temp:      TempSrc:      Resp: 14 13 15 20   SpO2: 100% 100% 100% 100%     MDM  Vitals stable , tachycardia improving in the ED with by mouth Ativan and by mouth fluids.-afebrile Pt resting comfortably in ED.  Denies any discomfort in ED. PE--physical exam reveals tachycardic rate with no other objective findings. EKG shows sinus tachycardia without acute ischemic changes, unchanged from previous. Discussed with patient that he continues to use methamphetamines he will continue to have fast heart rates and feel anxious. Discussed follow-up with his outpatient substance abuse program.  I discussed all relevant lab findings and imaging results with pt and they verbalized understanding. Discussed f/u with PCP within 48 hrs and return precautions, pt very amenable to plan.  Final diagnoses:  Tachycardia  Substance abuse       Joycie Peek, PA-C 04/03/15 [address] PA-C 04/03/15 1610  Arby Barrette, MD 04/11/15 1452

## 2015-04-03 NOTE — Discharge Instructions (Signed)
Polysubstance Abuse °When people abuse more than one drug or type of drug it is called polysubstance or polydrug abuse. For example, many smokers also drink alcohol. This is one form of polydrug abuse. Polydrug abuse also refers to the use of a drug to counteract an unpleasant effect produced by another drug. It may also be used to help with withdrawal from another drug. People who take stimulants may become agitated. Sometimes this agitation is countered with a tranquilizer. This helps protect against the unpleasant side effects. Polydrug abuse also refers to the use of different drugs at the same time.  °Anytime drug use is interfering with normal living activities, it has become abuse. This includes problems with family and friends. Psychological dependence has developed when your mind tells you that the drug is needed. This is usually followed by physical dependence which has developed when continuing increases of drug are required to get the same feeling or "high". This is known as addiction or chemical dependency. A person's risk is much higher if there is a history of chemical dependency in the family. °SIGNS OF CHEMICAL DEPENDENCY °· You have been told by friends or family that drugs have become a problem. °· You fight when using drugs. °· You are having blackouts (not remembering what you do while using). °· You feel sick from using drugs but continue using. °· You lie about use or amounts of drugs (chemicals) used. °· You need chemicals to get you going. °· You are suffering in work performance or in school because of drug use. °· You get sick from use of drugs but continue to use anyway. °· You need drugs to relate to people or feel comfortable in social situations. °· You use drugs to forget problems. °"Yes" answered to any of the above signs of chemical dependency indicates there are problems. The longer the use of drugs continues, the greater the problems will become. °If there is a family history of  drug or alcohol use, it is best not to experiment with these drugs. Continual use leads to tolerance. After tolerance develops more of the drug is needed to get the same feeling. This is followed by addiction. With addiction, drugs become the most important part of life. It becomes more important to take drugs than participate in the other usual activities of life. This includes relating to friends and family. Addiction is followed by dependency. Dependency is a condition where drugs are now needed not just to get high, but to feel normal. °Addiction cannot be cured but it can be stopped. This often requires outside help and the care of professionals. Treatment centers are listed in the yellow pages under: Cocaine, Narcotics, and Alcoholics Anonymous. Most hospitals and clinics can refer you to a specialized care center. Talk to your caregiver if you need help. °Document Released: 04/11/2005 Document Revised: 11/12/2011 Document Reviewed: 08/20/2005 °Exit[employer]® Patient Information ©2015 [employer]. This information is not intended to replace advice given to you by your health care provider. Make sure you discuss any questions you have with your health care provider. ° °

## 2015-04-03 NOTE — ED Notes (Signed)
Pt reported using IV meth at 1100 and having anxiety and palpitations, and SHOB. Pt denies chest pain.

## 2015-04-03 NOTE — ED Notes (Signed)
Bed: ON62 Expected date: 04/03/15 Expected time: 12:19 PM Means of arrival: Ambulance Comments: Anxiety, SHOB, recent substance abuse

## 2015-04-04 ENCOUNTER — Emergency Department (HOSPITAL_COMMUNITY)
Admission: EM | Admit: 2015-04-04 | Discharge: 2015-04-04 | Disposition: A | Discharge status: Home / Self Care | Payer: BLUE CROSS/BLUE SHIELD | Attending: Emergency Medicine | Admitting: Emergency Medicine

## 2015-04-04 ENCOUNTER — Encounter (HOSPITAL_COMMUNITY): Payer: Self-pay | Admitting: Emergency Medicine

## 2015-04-04 DIAGNOSIS — R079 Chest pain, unspecified: Secondary | ICD-10-CM | POA: Insufficient documentation

## 2015-04-04 DIAGNOSIS — R0602 Shortness of breath: Secondary | ICD-10-CM | POA: Insufficient documentation

## 2015-04-04 DIAGNOSIS — R000 Tachycardia, unspecified: Secondary | ICD-10-CM | POA: Diagnosis not present

## 2015-04-04 DIAGNOSIS — F419 Anxiety disorder, unspecified: Secondary | ICD-10-CM | POA: Insufficient documentation

## 2015-04-04 DIAGNOSIS — F1510 Other stimulant abuse, uncomplicated: Secondary | ICD-10-CM

## 2015-04-04 DIAGNOSIS — R002 Palpitations: Secondary | ICD-10-CM | POA: Diagnosis present

## 2015-04-04 DIAGNOSIS — Z79899 Other long term (current) drug therapy: Secondary | ICD-10-CM | POA: Insufficient documentation

## 2015-04-04 DIAGNOSIS — Z21 Asymptomatic human immunodeficiency virus [HIV] infection status: Secondary | ICD-10-CM | POA: Insufficient documentation

## 2015-04-04 DIAGNOSIS — Z720 Tobacco use: Secondary | ICD-10-CM | POA: Insufficient documentation

## 2015-04-04 MED ORDER — LORAZEPAM 2 MG/ML IJ SOLN
1.0000 mg | Freq: Once | INTRAMUSCULAR | Status: AC
  Administered 2015-04-04: 1 mg via INTRAMUSCULAR
  Filled 2015-04-04: qty 1

## 2015-04-04 MED ORDER — LORAZEPAM 1 MG PO TABS
1.0000 mg | ORAL_TABLET | Freq: Once | ORAL | Status: AC
  Administered 2015-04-04: 1 mg via ORAL
  Filled 2015-04-04: qty 1

## 2015-04-04 NOTE — ED Notes (Signed)
Pt states that he was seen here yesterday after injecting meth.  Pt states that he is still having lightheadedness, anxiousness and feels like his heart is racing.  Pt states last use was yesterday.

## 2015-04-04 NOTE — ED Provider Notes (Signed)
CSN: [id number]     Arrival date & time 04/04/15  1135 History   First MD Initiated Contact with Patient 04/04/15 1137     Chief Complaint  Patient presents with  . Palpitations  . anxious   . Dizziness     (Consider location/radiation/quality/duration/timing/severity/associated sxs/prior Treatment) HPI Comments: Patient with a history of methamphetamine use presents with tachycardia and anxiety. He states he did inject crystal meth this morning. He reports following that he had palpitations and feeling that his heart was racing. He feels anxious. He feels a little bit short of breath. Denies any chest pain. He denies any leg pain or swelling. He's had multiple visits for similar symptoms.   Past Medical History  Diagnosis Date  . HIV (human immunodeficiency virus infection)    History reviewed. No pertinent past surgical history. No family history on file. History  Substance Use Topics  . Smoking status: Current Every Day Smoker    Types: Cigarettes  . Smokeless tobacco: Never Used  . Alcohol Use: No    Review of Systems  Constitutional: Negative for fever, chills, diaphoresis and fatigue.  HENT: Negative for congestion, rhinorrhea and sneezing.   Eyes: Negative.   Respiratory: Positive for shortness of breath. Negative for cough and chest tightness.   Cardiovascular: Positive for chest pain and palpitations. Negative for leg swelling.  Gastrointestinal: Negative for nausea, vomiting, abdominal pain, diarrhea and blood in stool.  Genitourinary: Negative for frequency, hematuria, flank pain and difficulty urinating.  Musculoskeletal: Negative for back pain and arthralgias.  Skin: Negative for rash.  Neurological: Negative for dizziness, speech difficulty, weakness, numbness and headaches.  Psychiatric/Behavioral: The patient is nervous/anxious.       Allergies  Review of patient's allergies indicates no known allergies.  Home Medications   Prior to Admission  medications   Medication Sig Start Date End Date Taking? Authorizing Provider  elvitegravir-cobicistat-emtricitabine-tenofovir (STRIBILD) 150-150-200-300 MG TABS tablet Take 1 tablet by mouth daily with breakfast.   Yes Historical Provider, MD  minocycline (MINOCIN,DYNACIN) 100 MG capsule Take 1 capsule by mouth daily. 03/10/15  Yes Historical Provider, MD  LORazepam (ATIVAN) 1 MG tablet Take 1 tablet (1 mg total) by mouth 3 (three) times daily as needed for anxiety. Patient not taking: Reported on 03/07/2015 10/03/14   Derwood Kaplan, MD   BP 122/78 mmHg  Pulse 119  Temp(Src) 98.3 F (36.8 C) (Oral)  Resp 22  SpO2 100% Physical Exam  Constitutional: He is oriented to person, place, and time. He appears well-developed and well-nourished.  HENT:  Head: Normocephalic and atraumatic.  Eyes: Pupils are equal, round, and reactive to light.  Neck: Normal range of motion. Neck supple.  Cardiovascular: Regular rhythm and normal heart sounds.  Tachycardia present.   Pulmonary/Chest: Effort normal and breath sounds normal. No respiratory distress. He has no wheezes. He has no rales. He exhibits no tenderness.  Abdominal: Soft. Bowel sounds are normal. There is no tenderness. There is no rebound and no guarding.  Musculoskeletal: Normal range of motion. He exhibits no edema.  No edema or calf tenderness  Lymphadenopathy:    He has no cervical adenopathy.  Neurological: He is alert and oriented to person, place, and time.  Skin: Skin is warm and dry. No rash noted.  Psychiatric: He has a normal mood and affect.    ED Course  Procedures (including critical care time) Labs Review Labs Reviewed - No data to display  Imaging Review No results found.   EKG Interpretation  Date/Time:  Monday April 04 2015 11:40:21 EDT Ventricular Rate:  126 PR Interval:  117 QRS Duration: 91 QT Interval:  289 QTC Calculation: 418 R Axis:   83 Text Interpretation:  Sinus tachycardia Ventricular premature  complex  Aberrant complex Borderline T abnormalities, inferior leads Baseline  wander in lead(s) V5 since last tracing no significant change Confirmed by  Aithan Farrelly  MD, Mattie Novosel ([id number]) on 04/04/2015 12:08:24 PM      MDM   Final diagnoses:  Tachycardia  Methamphetamine abuse    Patient's heart rate is improving. He was given Ativan in the ED. He is feeling much better. I feel this tachycardia is resulting from his methamphetamine use. There is no other associated symptoms or hypoxia to suggest other etiologies.    Rolan Bucco, MD 04/04/15 [phone]

## 2015-04-04 NOTE — Discharge Instructions (Signed)
Nonspecific Tachycardia Tachycardia is a faster than normal heartbeat (more than 100 beats per minute). In adults, the heart normally beats between 60 and 100 times a minute. A fast heartbeat may be a normal response to exercise or stress. It does not necessarily mean that something is wrong. However, sometimes when your heart beats too fast it may not be able to pump enough blood to the rest of your body. This can result in chest pain, shortness of breath, dizziness, and even fainting. Nonspecific tachycardia means that the specific cause or pattern of your tachycardia is unknown. CAUSES  Tachycardia may be harmless or it may be due to a more serious underlying cause. Possible causes of tachycardia include:  Exercise or exertion.  Fever.  Pain or injury.  Infection.  Loss of body fluids (dehydration).  Overactive thyroid.  Lack of red blood cells (anemia).  Anxiety and stress.  Alcohol.  Caffeine.  Tobacco products.  Diet pills.  Illegal drugs.  Heart disease. SYMPTOMS  Rapid or irregular heartbeat (palpitations).  Suddenly feeling your heart beating (cardiac awareness).  Dizziness.  Tiredness (fatigue).  Shortness of breath.  Chest pain.  Nausea.  Fainting. DIAGNOSIS  Your caregiver will perform a physical exam and take your medical history. In some cases, a heart specialist (cardiologist) may be consulted. Your caregiver may also order:  Blood tests.  Electrocardiography. This test records the electrical activity of your heart.  A heart monitoring test. TREATMENT  Treatment will depend on the likely cause of your tachycardia. The goal is to treat the underlying cause of your tachycardia. Treatment methods may include:  Replacement of fluids or blood through an intravenous (IV) tube for moderate to severe dehydration or anemia.  New medicines or changes in your current medicines.  Diet and lifestyle changes.  Treatment for certain  infections.  Stress relief or relaxation methods. HOME CARE INSTRUCTIONS   Rest.  Drink enough fluids to keep your urine clear or pale yellow.  Do not smoke.  Avoid:  Caffeine.  Tobacco.  Alcohol.  Chocolate.  Stimulants such as over-the-counter diet pills or pills that help you stay awake.  Situations that cause anxiety or stress.  Illegal drugs such as marijuana, phencyclidine (PCP), and cocaine.  Only take medicine as directed by your caregiver.  Keep all follow-up appointments as directed by your caregiver. SEEK IMMEDIATE MEDICAL CARE IF:   You have pain in your chest, upper arms, jaw, or neck.  You become weak, dizzy, or feel faint.  You have palpitations that will not go away.  You vomit, have diarrhea, or pass blood in your stool.  Your skin is cool, pale, and wet.  You have a fever that will not go away with rest, fluids, and medicine. MAKE SURE YOU:   Understand these instructions.  Will watch your condition.  Will get help right away if you are not doing well or get worse. Document Released: 09/27/2004 Document Revised: 11/12/2011 Document Reviewed: 07/31/2011 [facility] Patient Information 2015 [place], [place]. This information is not intended to replace advice given to you by your health care provider. Make sure you discuss any questions you have with your health care provider.  Stimulant Use Disorder-Methamphetamines Methamphetamines are one of a group of powerful drugs known as stimulants. Methamphetamine is a form of amphetamine. It is rarely used medically but is often misused because of the effects it produces. These effects include:  A feeling of extreme pleasure (euphoria).  Alertness.  High energy.  Increased sexuality. Common street names  for methamphetamine are meth, crystal, ice, glass, and chalk. This drug can be taken by mouth, smoked, snorted, or dissolved in water and injected. Stimulants are addictive because they activate  regions of the brain that are responsible for producing both the pleasurable sensation of "reward" and psychological dependence. Together, these actions account for loss of control and the rapid development of drug dependence. This means the user will become ill without the drug (withdrawal) and will need to keep using it to function.  Stimulant use disorder is use of stimulants that disrupts your daily life. It disrupts relationships with family and friends and how you do your job. Methamphetamine increases blood pressure and heart rate. Use can cause a heart attack or stroke. Methamphetamine can also cause death from irregular heart rate or seizures. SIGNS AND SYMPTOMS  Signs and symptoms of stimulant use disorder with methamphetamine include:  Use of methamphetamines in larger amounts or over a longer period than intended.  Unsuccessful attempts to cut down or control methamphetamine use.  A lot of time spent obtaining, using, or recovering from the effects of methamphetamines.  A strong desire or urge to use methamphetamines (craving).  Continued use of methamphetamines in spite of major problems at work, school, or home because of use.  Continued use of methamphetamines in spite of relationship problems because of use.  Giving up or cutting down on important life activities because of use of methamphetamines.  Use of methamphetamines over and over in situations when it is physically hazardous, such as driving a car.  Continued use of methamphetamines in spite of a physical problem that is likely related to use. Physical problems can include:  Extreme weight loss.  Malnutrition.  Jaw clenching.  Severe dental problems (meth mouth).  Lung problems (due to smoking).  Skin sores (due to scratching).  Infections such as human immunodeficiency virus (HIV) and hepatitis (from injecting methamphetamines).  Continued use of methamphetamines in spite of a mental problem that is likely  related to use. Mental problems can include:  Memory problems.  Schizophrenia-like symptoms.  Depression.  Bipolar mood swings.  Violent behavior.  Anxiety.  Sleep problems.  Need to use more and more methamphetamines to get the same effect, or lessened effect over time with use of the same amount (tolerance).  Having withdrawal symptoms when methamphetamine use is stopped, or using methamphetamines to reduce or avoid withdrawal symptoms. Withdrawal symptoms include:  Depressed or irritable mood.  Low energy or restlessness.  Bad dreams.  Too little or too much sleep.  Increased appetite. DIAGNOSIS Stimulant use disorder is diagnosed by your health care provider. You may be asked questions about your methamphetamine use and how it affects your life. A physical exam may be done. A drug screen may be ordered. You may be referred to a mental health professional. The diagnosis of stimulant use disorder requires at least two symptoms within 12 months. The type of stimulant use disorder depends on the number of symptoms you have. The type may be:  Mild. Two or three signs and symptoms.  Moderate. Four or five signs and symptoms.  Severe. Six or more signs and symptoms. TREATMENT  There are two types of treatment:   Short-term (urgent) medical treatment. This helps to preserve life and prevent or minimize damage from the physical or mental problems related to methamphetamine use.   Long-term substance abuse treatment. This focuses on recovery from use disorder. It is provided by mental health professionals who have training in substance use disorders.  It is usually a combination of counseling, support groups, and nonaddictive medicines that can reduce cravings or block the effects of methamphetamine. HOME CARE INSTRUCTIONS   Take medicines only as directed by your health care provider.  Identify the people and activities that trigger your methamphetamine use and avoid  them.  Keep all follow-up visits as directed by your health care provider. SEEK MEDICAL CARE IF:  Your symptoms get worse or you relapse.  You are not able to take medicines as directed.  SEEK IMMEDIATE MEDICAL CARE IF:   You have serious thoughts about hurting yourself or others.  You have a seizure, chest pain, sudden weakness, or loss of speech or vision. FOR MORE INFORMATION  National Institute on Drug Abuse: http://www.price-smith.com/  Substance Abuse and Mental Health Services Administration: [employer].pt Document Released: 04/25/2004 Document Revised: 01/04/2014 Document Reviewed: 09/02/2013 [facility] Patient Information 2015 [place], [place]. This information is not intended to replace advice given to you by your health care provider. Make sure you discuss any questions you have with your health care provider.

## 2015-04-11 ENCOUNTER — Encounter (HOSPITAL_COMMUNITY): Payer: Self-pay | Admitting: Emergency Medicine

## 2015-04-11 ENCOUNTER — Emergency Department (HOSPITAL_COMMUNITY): Payer: BLUE CROSS/BLUE SHIELD

## 2015-04-11 DIAGNOSIS — Z21 Asymptomatic human immunodeficiency virus [HIV] infection status: Secondary | ICD-10-CM | POA: Insufficient documentation

## 2015-04-11 DIAGNOSIS — F1510 Other stimulant abuse, uncomplicated: Secondary | ICD-10-CM | POA: Insufficient documentation

## 2015-04-11 DIAGNOSIS — Z79899 Other long term (current) drug therapy: Secondary | ICD-10-CM | POA: Insufficient documentation

## 2015-04-11 DIAGNOSIS — R002 Palpitations: Secondary | ICD-10-CM | POA: Diagnosis present

## 2015-04-11 DIAGNOSIS — R079 Chest pain, unspecified: Secondary | ICD-10-CM | POA: Insufficient documentation

## 2015-04-11 DIAGNOSIS — R0602 Shortness of breath: Secondary | ICD-10-CM | POA: Insufficient documentation

## 2015-04-11 DIAGNOSIS — R61 Generalized hyperhidrosis: Secondary | ICD-10-CM | POA: Diagnosis not present

## 2015-04-11 DIAGNOSIS — R000 Tachycardia, unspecified: Secondary | ICD-10-CM | POA: Insufficient documentation

## 2015-04-11 DIAGNOSIS — Z792 Long term (current) use of antibiotics: Secondary | ICD-10-CM | POA: Diagnosis not present

## 2015-04-11 DIAGNOSIS — Z720 Tobacco use: Secondary | ICD-10-CM | POA: Insufficient documentation

## 2015-04-11 DIAGNOSIS — F419 Anxiety disorder, unspecified: Secondary | ICD-10-CM | POA: Diagnosis not present

## 2015-04-11 LAB — CBC
HEMATOCRIT: 38.4 % — AB (ref 39.0–52.0)
HEMOGLOBIN: 13.5 g/dL (ref 13.0–17.0)
MCH: 32.5 pg (ref 26.0–34.0)
MCHC: 35.2 g/dL (ref 30.0–36.0)
MCV: 92.3 fL (ref 78.0–100.0)
Platelets: 237 10*3/uL (ref 150–400)
RBC: 4.16 MIL/uL — ABNORMAL LOW (ref 4.22–5.81)
RDW: 12 % (ref 11.5–15.5)
WBC: 6.7 10*3/uL (ref 4.0–10.5)

## 2015-04-11 LAB — I-STAT TROPONIN, ED: Troponin i, poc: 0 ng/mL (ref 0.00–0.08)

## 2015-04-11 NOTE — ED Notes (Signed)
Pt sts he used crystal meth approx 1 hour ago and is now having palpitations and sob.

## 2015-04-12 ENCOUNTER — Emergency Department (HOSPITAL_COMMUNITY)
Admission: EM | Admit: 2015-04-12 | Discharge: 2015-04-12 | Disposition: A | Discharge status: Home / Self Care | Payer: BLUE CROSS/BLUE SHIELD | Attending: Emergency Medicine | Admitting: Emergency Medicine

## 2015-04-12 DIAGNOSIS — R000 Tachycardia, unspecified: Secondary | ICD-10-CM

## 2015-04-12 DIAGNOSIS — F1510 Other stimulant abuse, uncomplicated: Secondary | ICD-10-CM

## 2015-04-12 LAB — BASIC METABOLIC PANEL
Anion gap: 12 (ref 5–15)
BUN: 17 mg/dL (ref 6–20)
CO2: 22 mmol/L (ref 22–32)
Calcium: 9.6 mg/dL (ref 8.9–10.3)
Chloride: 103 mmol/L (ref 101–111)
Creatinine, Ser: 1.24 mg/dL (ref 0.61–1.24)
GFR calc Af Amer: >60 mL/min (ref >60)
GFR calc non Af Amer: >60 mL/min (ref >60)
GLUCOSE: 101 mg/dL — AB (ref 65–99)
POTASSIUM: 3.5 mmol/L (ref 3.5–5.1)
Sodium: 137 mmol/L (ref 135–145)

## 2015-04-12 MED ORDER — LORAZEPAM 1 MG PO TABS: 1.0000 mg | ORAL_TABLET | Freq: Three times a day (TID) | ORAL | Status: DC | PRN

## 2015-04-12 MED ORDER — LORAZEPAM 2 MG/ML IJ SOLN
INTRAMUSCULAR | Status: AC
  Filled 2015-04-12: qty 1

## 2015-04-12 MED ORDER — LORAZEPAM 2 MG/ML IJ SOLN
1.0000 mg | Freq: Once | INTRAMUSCULAR | Status: AC
  Administered 2015-04-12: 1 mg via INTRAVENOUS

## 2015-04-12 MED ORDER — POTASSIUM CHLORIDE CRYS ER 20 MEQ PO TBCR
40.0000 meq | EXTENDED_RELEASE_TABLET | Freq: Once | ORAL | Status: AC
  Administered 2015-04-12: 40 meq via ORAL
  Filled 2015-04-12: qty 2

## 2015-04-12 MED ORDER — MAGNESIUM SULFATE 2 GM/50ML IV SOLN
2.0000 g | Freq: Once | INTRAVENOUS | Status: AC
  Administered 2015-04-12: 2 g via INTRAVENOUS
  Filled 2015-04-12: qty 50

## 2015-04-12 MED ORDER — POTASSIUM CHLORIDE CRYS ER 20 MEQ PO TBCR: 20.0000 meq | EXTENDED_RELEASE_TABLET | Freq: Two times a day (BID) | ORAL | Status: DC

## 2015-04-12 NOTE — ED Notes (Signed)
Pt stable, ambulatory, states understanding of discharge instructions 

## 2015-04-12 NOTE — Discharge Instructions (Signed)
Stimulant Use Disorder-Amphetamines  °Amphetamines are one of a group of powerful drugs known as stimulants. Amphetamines have a number of medical uses, including the treatment of a daytime sleepiness disorder due to narcolepsy or sleep apnea, attention deficit hyperactivity disorder, and chronic fatigue syndrome. However, amphetamines also are often misused because of the effects they produce. These effects include: °· A feeling of extreme pleasure (euphoria). °· Alertness. °· Increased attention. °· High energy. °· Loss of appetite for weight loss. °Common street names for these drugs include speed and crank. Amphetamines are taken by mouth, crushed and snorted, or dissolved in water and injected. °Stimulants are addictive because they activate regions of the brain that are responsible for producing both the pleasurable sensation of "reward" and psychological dependence. Together, these actions account for loss of control and the rapid development of drug dependence. This means you will become ill without the drug (withdrawal) and need to keep using it to function.  °Stimulant use disorder is use of stimulants that disrupts your daily life. It disrupts relationships with family and friends and how you do your job. Amphetamines increase blood pressure and heart rate. Use can lead to heart attack or stroke. Use can also cause death from irregular heart rate, seizures, or dangerously high body temperature. °SIGNS AND SYMPTOMS  °Symptoms of stimulant use disorder with amphetamines include: °· Use of amphetamines in larger amounts or over a longer period than intended. °· Unsuccessful attempts to cut down or control amphetamine use. °· A lot of time spent obtaining, using, or recovering from the effects of amphetamines. °· A strong desire or urge to use amphetamines (craving). °· Continued use of amphetamines in spite of major problems at work, school, or home because of use. °· Continued use of amphetamines in spite  of relationship problems because of use. °· Giving up or cutting down on important life activities because of amphetamine use. °· Use of amphetamines over and over in situations when it is physically hazardous, such as driving a car. °· Continued use of amphetamines in spite of a physical problem that is likely related to amphetamine use. Physical problems can include: °¨ Unintended weight loss. °¨ High blood pressure. °¨ Chest pain. °¨ Infections such as human immunodeficiency virus and hepatitis (from injecting amphetamines). °· Continued use of amphetamines in spite of mental problems that are likely related to use. Mental problems can include: °¨ Anxiety. °¨ Sleep problems. °¨ Schizophrenia-like symptoms. °¨ Depression. °¨ Bipolar mood swings. °¨ Violent behavior. °· Need to use more and more amphetamines to get the same effect, or lessened effect over time with use of the same amount (tolerance). °· Having withdrawal symptoms when amphetamine use is stopped, or using amphetamines to reduce or avoid withdrawal symptoms. Withdrawal symptoms include: °¨ Depressed mood. °¨ Low energy or restlessness. °¨ Bad dreams. °¨ Too little or too much sleep. °¨ Increased appetite. °DIAGNOSIS  °Stimulant use disorder is diagnosed by your health care provider. You may be asked questions about your amphetamine use and how it affects your life. A physical exam may be done. A drug screen may be ordered. You may be referred to a mental health professional. The diagnosis of stimulant use disorder requires two or more symptoms within 12 months. The type of stimulant use disorder you have depends on the number of signs and symptoms you have. The type may be: °· Mild. Two or three signs and symptoms. °· Moderate. Four or five signs and symptoms. °· Severe. Six or more signs   and symptoms. TREATMENT  The treatment for most problems related to stimulant use disorder with amphetamines may be divided into two types:  Short-term medical  treatment. This helps to preserve life and prevent or minimize damage from physical or mental problems related to use.  Long-term substance abuse treatment. This focuses on recovery from use disorder. It is provided by mental health professionals who have training in substance use disorders. It is usually a combination of counseling, support groups, and nonaddictive medicines that can reduce cravings or block the effects of amphetamines. HOME CARE INSTRUCTIONS   Take medicines only as directed by your health care provider.  Identify the people and activities that trigger your amphetamine use and avoid them.  Keep all follow-up visits as directed by your health care provider. SEEK MEDICAL CARE IF:  Your symptoms get worse or you relapse.  You are not able to take medicines as directed. SEEK IMMEDIATE MEDICAL CARE IF:   You have serious thoughts about hurting yourself or others.  You have a seizure, chest pain, sudden weakness, or loss of speech or vision. FOR MORE INFORMATION  National Institute on Drug Abuse: http://www.price-smith.com/  Substance Abuse and Mental Health Services Administration: SkateOasis.com.pt Document Released: 08/14/2001 Document Revised: 01/04/2014 Document Reviewed: 09/02/2013 [facility] Patient Information 2015 [place], [place]. This information is not intended to replace advice given to you by your health care provider. Make sure you discuss any questions you have with your health care provider.  Lorazepam tablets What is this medicine? LORAZEPAM (lor A ze pam) is a benzodiazepine. It is used to treat anxiety. This medicine may be used for other purposes; ask your health care provider or pharmacist if you have questions. COMMON BRAND NAME(S): Ativan What should I tell my health care provider before I take this medicine? They need to know if you have any of these conditions: -alcohol or drug abuse problem -bipolar disorder, depression, psychosis or other mental health  condition -glaucoma -kidney or liver disease -lung disease or breathing difficulties -myasthenia gravis -Parkinson's disease -seizures or a history of seizures -suicidal thoughts -an unusual or allergic reaction to lorazepam, other benzodiazepines, foods, dyes, or preservatives -pregnant or trying to get pregnant -breast-feeding How should I use this medicine? Take this medicine by mouth with a glass of water. Follow the directions on the prescription label. If it upsets your stomach, take it with food or milk. Take your medicine at regular intervals. Do not take it more often than directed. Do not stop taking except on the advice of your doctor or health care professional. Talk to your pediatrician regarding the use of this medicine in children. Special care may be needed. Overdosage: If you think you have taken too much of this medicine contact a poison control center or emergency room at once. NOTE: This medicine is only for you. Do not share this medicine with others. What if I miss a dose? If you miss a dose, take it as soon as you can. If it is almost time for your next dose, take only that dose. Do not take double or extra doses. What may interact with this medicine? -barbiturate medicines for inducing sleep or treating seizures, like phenobarbital -clozapine -medicines for depression, mental problems or psychiatric disturbances -medicines for sleep -phenytoin -probenecid -theophylline -valproic acid This list may not describe all possible interactions. Give your health care provider a list of all the medicines, herbs, non-prescription drugs, or dietary supplements you use. Also tell them if you smoke, drink alcohol, or use illegal  drugs. Some items may interact with your medicine. What should I watch for while using this medicine? Visit your doctor or health care professional for regular checks on your progress. Your body may become dependent on this medicine, ask your doctor or  health care professional if you still need to take it. However, if you have been taking this medicine regularly for some time, do not suddenly stop taking it. You must gradually reduce the dose or you may get severe side effects. Ask your doctor or health care professional for advice before increasing or decreasing the dose. Even after you stop taking this medicine it can still affect your body for several days. You may get drowsy or dizzy. Do not drive, use machinery, or do anything that needs mental alertness until you know how this medicine affects you. To reduce the risk of dizzy and fainting spells, do not stand or sit up quickly, especially if you are an older patient. Alcohol may increase dizziness and drowsiness. Avoid alcoholic drinks. Do not treat yourself for coughs, colds or allergies without asking your doctor or health care professional for advice. Some ingredients can increase possible side effects. What side effects may I notice from receiving this medicine? Side effects that you should report to your doctor or health care professional as soon as possible: -changes in vision -confusion -depression -mood changes, excitability or aggressive behavior -movement difficulty, staggering or jerky movements -muscle cramps -restlessness -weakness or tiredness Side effects that usually do not require medical attention (report to your doctor or health care professional if they continue or are bothersome): -constipation or diarrhea -difficulty sleeping, nightmares -dizziness, drowsiness -headache -nausea, vomiting This list may not describe all possible side effects. Call your doctor for medical advice about side effects. You may report side effects to FDA at 1-800-FDA-1088. Where should I keep my medicine? Keep out of the reach of children. This medicine can be abused. Keep your medicine in a safe place to protect it from theft. Do not share this medicine with anyone. Selling or giving away  this medicine is dangerous and against the law. Store at room temperature between 20 and 25 degrees C (68 and 77 degrees F). Protect from light. Keep container tightly closed. Throw away any unused medicine after the expiration date. NOTE: This sheet is a summary. It may not cover all possible information. If you have questions about this medicine, talk to your doctor, pharmacist, or health care provider.  2015, Elsevier/Gold Standard. (2008-02-20 14:58:20)  Potassium Salts tablets, extended-release tablets or capsules What is this medicine? POTASSIUM (poe TASS i um) is a natural salt that is important for the heart, muscles, and nerves. It is found in many foods and is normally supplied by a well balanced diet. This medicine is used to treat low potassium. This medicine may be used for other purposes; ask your health care provider or pharmacist if you have questions. COMMON BRAND NAME(S): ED-K+10, Glu-K, K-10, K-8, K-Dur, K-Tab, Kaon-CL, Klor-Con, Klor-Con M10, Klor-Con M15, Klor-Con M20, Klotrix, Micro-K, Micro-K Extencaps, Slow-K What should I tell my health care provider before I take this medicine? They need to know if you have any of these conditions: -dehydration -diabetes -irregular heartbeat -kidney disease -stomach ulcers or other stomach problems -an unusual or allergic reaction to potassium salts, other medicines, foods, dyes, or preservatives -pregnant or trying to get pregnant -breast-feeding How should I use this medicine? Take this medicine by mouth with a full glass of water. Follow the directions on the prescription  label. Take with food. Do not suck on, crush, or chew this medicine. If you have difficulty swallowing, ask the pharmacist how to take. Take your medicine at regular intervals. Do not take it more often than directed. Do not stop taking except on your doctor's advice. Talk to your pediatrician regarding the use of this medicine in children. Special care may be  needed. Overdosage: If you think you have taken too much of this medicine contact a poison control center or emergency room at once. NOTE: This medicine is only for you. Do not share this medicine with others. What if I miss a dose? If you miss a dose, take it as soon as you can. If it is almost time for your next dose, take only that dose. Do not take double or extra doses. What may interact with this medicine? Do not take this medicine with any of the following medications: -eplerenone -sodium polystyrene sulfonate This medicine may also interact with the following medications: -medicines for blood pressure or heart disease like lisinopril, losartan, quinapril, valsartan -medicines for cold or allergies -medicines for inflammation like ibuprofen, indomethacin -medicines for Parkinson's disease -medicines for the stomach like metoclopramide, dicyclomine, glycopyrrolate -some diuretics This list may not describe all possible interactions. Give your health care provider a list of all the medicines, herbs, non-prescription drugs, or dietary supplements you use. Also tell them if you smoke, drink alcohol, or use illegal drugs. Some items may interact with your medicine. What should I watch for while using this medicine? Visit your doctor or health care professional for regular check ups. You will need lab work done regularly. You may need to be on a special diet while taking this medicine. Ask your doctor. What side effects may I notice from receiving this medicine? Side effects that you should report to your doctor or health care professional as soon as possible: -allergic reactions like skin rash, itching or hives, swelling of the face, lips, or tongue -black, tarry stools -heartburn -irregular heartbeat -numbness or tingling in hands or feet -pain when swallowing -unusually weak or tired Side effects that usually do not require medical attention (report to your doctor or health care  professional if they continue or are bothersome): -diarrhea -nausea -stomach gas -vomiting This list may not describe all possible side effects. Call your doctor for medical advice about side effects. You may report side effects to FDA at 1-800-FDA-1088. Where should I keep my medicine? Keep out of the reach of children. Store at room temperature between 15 and 30 degrees C (59 and 86 degrees F ). Keep bottle closed tightly to protect this medicine from light and moisture. Throw away any unused medicine after the expiration date. NOTE: This sheet is a summary. It may not cover all possible information. If you have questions about this medicine, talk to your doctor, pharmacist, or health care provider.  2015, Elsevier/Gold Standard. (2007-11-05 11:17:31)   Emergency Department Resource Guide No Primary Care Doctor: - Call [facility] Connect at  [phone] - they can help you locate a primary care doctor that  accepts your insurance, provides certain services, etc. - Physician Referral Service- [phone]  Behavioral Health Resources in the Community: Intensive Outpatient Programs Organization         Address  Phone  Notes  [facility] [address], [place], [place] [phone]   [facility] [address], [place], [place] [phone]   [facility] [address],  [place], [place]  [phone]   [facility] [address],  [place], [place] [phone] or [phone]   Substance Abuse Resources Organization         Address  Phone  Notes  [facility]  [phone]   [facility]  [phone]   [facility]  [phone]   Floydene Flock  [phone]   Residential & Outpatient Substance Abuse Program  [phone]   Psychological Services Organization         Address  Phone  Notes  [facility]  [phone]   [facility]  [phone]    [facility] [address], [place] [phone] or [phone]    Mobile Crisis Teams Organization         Address  Phone  Notes  [facility], Mobile Crisis Care Unit  [phone]   [facility]  [address] [place], [place] [phone]   Doristine Locks [address] [place] [phone]    Self-Help/Support Groups Organization         Address  Phone             Notes  [facility] Assoc[facility] of  - variety of support groups  336- I7437963 Call for more information  Narcotics Anonymous (NA), [facility] [address], [employer] [place]  2 meetings at this location   Statistician         Address  Phone  Notes  [facility] [address],    [place]  [phone]   [facility]  [address] [place] [address], [place], [place] [phone]   [facility] [address] [place], [place] [phone] Admissions: 8am-3pm M-F  [facility] [address],    [place], [place] [phone]   [facility] [address] [place], [place] [phone]   [facility] [address],  [place], [place] [phone]   [facility] - Intensive Outpatient [address], [place], [place] [phone]   [facility] ([facility] [address] [place]  [place], [place] [phone] or [phone]   [facility] ([facility] [address], [place], [place] [phone] Accepts Medicaid  [facility] [address],  [place] [phone] Substance Abuse/Addiction Treatment

## 2015-04-12 NOTE — ED Provider Notes (Signed)
CSN: [id number]     Arrival date & time 04/11/15  2317 History   This chart was scribed for Dione Booze, MD by Arlan Organ, ED Scribe. This patient was seen in room B17C/B17C and the patient's care was started 2:20 AM.   Chief Complaint  Patient presents with  . Palpitations   The history is provided by the patient. No language interpreter was used.    HPI Comments: Douglas Dunn is a 34 y.o. male with a PMHx of HIV who presents to the Emergency Department complaining of constant, ongoing palpitations x 1 hour. He also reports shortness of breath, diaphoresis, and chest discomfort currently rated 7/10. 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 Last use earlier today. No OTC medications or home remedies attempted prior to arrival. No recent fever, chills, abdominal pain, nausea, or vomiting. He denies any SI or HI at this time.  Past Medical History  Diagnosis Date  . HIV (human immunodeficiency virus infection)    History reviewed. No pertinent past surgical history. No family history on file. History  Substance Use Topics  . Smoking status: Current Every Day Smoker    Types: Cigarettes  . Smokeless tobacco: Never Used  . Alcohol Use: No    Review of Systems  Constitutional: Positive for diaphoresis. Negative for fever and chills.  Respiratory: Positive for shortness of breath. Negative for cough.   Cardiovascular: Positive for chest pain and palpitations.  Gastrointestinal: Negative for nausea, vomiting and abdominal pain.  Neurological: Negative for headaches.  Psychiatric/Behavioral: The patient is nervous/anxious.   All other systems reviewed and are negative.     Allergies  Review of patient's allergies indicates no known allergies.  Home Medications   Prior to Admission medications   Medication Sig Start Date End Date Taking? Authorizing Provider  elvitegravir-cobicistat-emtricitabine-tenofovir (STRIBILD) 150-150-200-300 MG TABS tablet  Take 1 tablet by mouth daily with breakfast.    Historical Provider, MD  LORazepam (ATIVAN) 1 MG tablet Take 1 tablet (1 mg total) by mouth 3 (three) times daily as needed for anxiety. Patient not taking: Reported on 03/07/2015 10/03/14   Derwood Kaplan, MD  minocycline (MINOCIN,DYNACIN) 100 MG capsule Take 1 capsule by mouth daily. 03/10/15   Historical Provider, MD   Triage Vitals: BP 115/92 mmHg  Pulse 127  Temp(Src) 98.7 F (37.1 C) (Oral)  Resp 22  Ht 6\' 1"  (1.854 m)  Wt 186 lb (84.369 kg)  BMI 24.55 kg/m2  SpO2 100%   Physical Exam  Constitutional: He is oriented to person, place, and time. He appears well-developed and well-nourished.  Appears anxious   HENT:  Head: Normocephalic and atraumatic.  Eyes: EOM are normal. Pupils are equal, round, and reactive to light.  Neck: Normal range of motion. Neck supple. No JVD present.  Cardiovascular: Regular rhythm, normal heart sounds and intact distal pulses.  Tachycardia present.   No murmur heard. Pulmonary/Chest: Effort normal and breath sounds normal. He has no wheezes. He has no rales. He exhibits no tenderness.  Abdominal: Soft. Bowel sounds are normal. He exhibits no distension and no mass. There is no tenderness.  Musculoskeletal: Normal range of motion. He exhibits no edema.  Lymphadenopathy:    He has no cervical adenopathy.  Neurological: He is alert and oriented to person, place, and time. No cranial nerve deficit. He exhibits normal muscle tone. Coordination normal.  Skin: Skin is warm and dry. No rash noted.  Psychiatric: His behavior is normal. Judgment and thought content normal. His mood appears  anxious.  Nursing note and vitals reviewed.   ED Course  Procedures (including critical care time)  DIAGNOSTIC STUDIES: Oxygen Saturation is 100% on RA, Normal by my interpretation.    COORDINATION OF CARE: 2:25 AM- Will order CXR, i-stat troponin, BMP, and CBC. Discussed treatment plan with pt at bedside and pt agreed to  plan.     Labs Review Results for orders placed or performed during the hospital encounter of 04/12/15  Basic metabolic panel  Result Value Ref Range   Sodium 137 135 - 145 mmol/L   Potassium 3.5 3.5 - 5.1 mmol/L   Chloride 103 101 - 111 mmol/L   CO2 22 22 - 32 mmol/L   Glucose, Bld 101 (H) 65 - 99 mg/dL   BUN 17 6 - 20 mg/dL   Creatinine, Ser 4.09 0.61 - 1.24 mg/dL   Calcium 9.6 8.9 - 81.1 mg/dL   GFR calc non Af Amer >60 >60 mL/min   GFR calc Af Amer >60 >60 mL/min   Anion gap 12 5 - 15  CBC  Result Value Ref Range   WBC 6.7 4.0 - 10.5 K/uL   RBC 4.16 (L) 4.22 - 5.81 MIL/uL   Hemoglobin 13.5 13.0 - 17.0 g/dL   HCT 91.4 (L) 78.2 - 95.6 %   MCV 92.3 78.0 - 100.0 fL   MCH 32.5 26.0 - 34.0 pg   MCHC 35.2 30.0 - 36.0 g/dL   RDW 21.3 08.6 - 57.8 %   Platelets 237 150 - 400 K/uL  I-stat troponin, ED  Result Value Ref Range   Troponin i, poc 0.00 0.00 - 0.08 ng/mL   Comment 3           Imaging Review Dg Chest 2 View  04/12/2015   CLINICAL DATA:  Shortness of breath and chest pain  EXAM: CHEST  2 VIEW  COMPARISON:  October 03, 2014  FINDINGS: Lungs are clear. Heart size and pulmonary vascularity are normal. No adenopathy. No pneumothorax. No bone lesions.  IMPRESSION: No abnormality noted.   Electronically Signed   By: Bretta Bang III M.D.   On: 04/12/2015 00:55     EKG Interpretation   Date/Time:  Monday April 11 2015 23:21:55 EDT Ventricular Rate:  132 PR Interval:    QRS Duration: 84 QT Interval:  386 QTC Calculation: 571 R Axis:   81 Text Interpretation:  ** Critical Test Result: Long QTc Sinus tachycardia  Otherwise normal ECG When compared with ECG of 04/04/2015, QT has lengthened  Confirmed by [facility]  MD, Winter Trefz ([id number]) on 04/12/2015 1:37:56 AM      MDM   Final diagnoses:  Sinus tachycardia  Methamphetamine abuse    Palpitations secondary to NSAID amenable to some. Patient was counseled of need to not take and vitamins. He feels much better after dose of  lorazepam. He is discharged with prescription for lorazepam and advised to abstain from and phentermine use. He is given resource guide to try to find appropriate drug program.  I personally performed the services described in this documentation, which was scribed in my presence. The recorded information has been reviewed and is accurate.     Dione Booze, MD 04/12/15 0700

## 2015-04-19 ENCOUNTER — Encounter (HOSPITAL_COMMUNITY): Payer: Self-pay | Admitting: Emergency Medicine

## 2015-04-19 ENCOUNTER — Observation Stay (HOSPITAL_COMMUNITY)
Admission: EM | Admit: 2015-04-19 | Discharge: 2015-04-19 | Disposition: A | Discharge status: Home / Self Care | Payer: BLUE CROSS/BLUE SHIELD | Attending: Student in an Organized Health Care Education/Training Program | Admitting: Student in an Organized Health Care Education/Training Program

## 2015-04-19 ENCOUNTER — Emergency Department (HOSPITAL_COMMUNITY): Payer: BLUE CROSS/BLUE SHIELD

## 2015-04-19 DIAGNOSIS — R05 Cough: Secondary | ICD-10-CM | POA: Diagnosis not present

## 2015-04-19 DIAGNOSIS — R072 Precordial pain: Secondary | ICD-10-CM | POA: Insufficient documentation

## 2015-04-19 DIAGNOSIS — Z21 Asymptomatic human immunodeficiency virus [HIV] infection status: Secondary | ICD-10-CM | POA: Diagnosis not present

## 2015-04-19 DIAGNOSIS — R079 Chest pain, unspecified: Secondary | ICD-10-CM | POA: Diagnosis not present

## 2015-04-19 DIAGNOSIS — F1510 Other stimulant abuse, uncomplicated: Secondary | ICD-10-CM | POA: Diagnosis present

## 2015-04-19 DIAGNOSIS — R0602 Shortness of breath: Secondary | ICD-10-CM | POA: Insufficient documentation

## 2015-04-19 DIAGNOSIS — F1910 Other psychoactive substance abuse, uncomplicated: Secondary | ICD-10-CM

## 2015-04-19 DIAGNOSIS — Z79899 Other long term (current) drug therapy: Secondary | ICD-10-CM | POA: Insufficient documentation

## 2015-04-19 DIAGNOSIS — R002 Palpitations: Secondary | ICD-10-CM | POA: Diagnosis not present

## 2015-04-19 DIAGNOSIS — I4581 Long QT syndrome: Secondary | ICD-10-CM

## 2015-04-19 DIAGNOSIS — F419 Anxiety disorder, unspecified: Secondary | ICD-10-CM | POA: Diagnosis not present

## 2015-04-19 DIAGNOSIS — R61 Generalized hyperhidrosis: Secondary | ICD-10-CM | POA: Insufficient documentation

## 2015-04-19 DIAGNOSIS — Z720 Tobacco use: Secondary | ICD-10-CM | POA: Insufficient documentation

## 2015-04-19 DIAGNOSIS — E876 Hypokalemia: Secondary | ICD-10-CM | POA: Diagnosis not present

## 2015-04-19 DIAGNOSIS — F410 Panic disorder [episodic paroxysmal anxiety] without agoraphobia: Secondary | ICD-10-CM | POA: Diagnosis present

## 2015-04-19 DIAGNOSIS — R000 Tachycardia, unspecified: Secondary | ICD-10-CM | POA: Diagnosis not present

## 2015-04-19 DIAGNOSIS — F418 Other specified anxiety disorders: Secondary | ICD-10-CM

## 2015-04-19 DIAGNOSIS — R9431 Abnormal electrocardiogram [ECG] [EKG]: Secondary | ICD-10-CM | POA: Diagnosis present

## 2015-04-19 DIAGNOSIS — B20 Human immunodeficiency virus [HIV] disease: Secondary | ICD-10-CM | POA: Diagnosis present

## 2015-04-19 HISTORY — DX: Anxiety disorder, unspecified: F41.9

## 2015-04-19 LAB — I-STAT CHEM 8, ED
BUN: 15 mg/dL (ref 6–20)
CHLORIDE: 104 mmol/L (ref 101–111)
Calcium, Ion: 1.15 mmol/L (ref 1.12–1.23)
Creatinine, Ser: 1.3 mg/dL — ABNORMAL HIGH (ref 0.61–1.24)
Glucose, Bld: 95 mg/dL (ref 65–99)
HCT: 40 % (ref 39.0–52.0)
Hemoglobin: 13.6 g/dL (ref 13.0–17.0)
Potassium: 3.3 mmol/L — ABNORMAL LOW (ref 3.5–5.1)
SODIUM: 138 mmol/L (ref 135–145)
TCO2: 19 mmol/L (ref 0–100)

## 2015-04-19 LAB — CBC WITH DIFFERENTIAL/PLATELET
BASOS ABS: 0 10*3/uL (ref 0.0–0.1)
BASOS PCT: 0 % (ref 0–1)
Eosinophils Absolute: 0 10*3/uL (ref 0.0–0.7)
Eosinophils Relative: 0 % (ref 0–5)
HEMATOCRIT: 39.9 % (ref 39.0–52.0)
HEMOGLOBIN: 14.3 g/dL (ref 13.0–17.0)
LYMPHS PCT: 17 % (ref 12–46)
Lymphs Abs: 2 10*3/uL (ref 0.7–4.0)
MCH: 33.3 pg (ref 26.0–34.0)
MCHC: 35.8 g/dL (ref 30.0–36.0)
MCV: 92.8 fL (ref 78.0–100.0)
Monocytes Absolute: 1.3 10*3/uL — ABNORMAL HIGH (ref 0.1–1.0)
Monocytes Relative: 11 % (ref 3–12)
NEUTROS ABS: 8.3 10*3/uL — AB (ref 1.7–7.7)
NEUTROS PCT: 72 % (ref 43–77)
Platelets: 338 10*3/uL (ref 150–400)
RBC: 4.3 MIL/uL (ref 4.22–5.81)
RDW: 12.2 % (ref 11.5–15.5)
WBC: 11.7 10*3/uL — ABNORMAL HIGH (ref 4.0–10.5)

## 2015-04-19 LAB — I-STAT TROPONIN, ED
TROPONIN I, POC: 0.01 ng/mL (ref 0.00–0.08)
TROPONIN I, POC: 0.01 ng/mL (ref 0.00–0.08)

## 2015-04-19 LAB — COMPREHENSIVE METABOLIC PANEL
ALBUMIN: 4.3 g/dL (ref 3.5–5.0)
ALK PHOS: 65 U/L (ref 38–126)
ALT: 24 U/L (ref 17–63)
AST: 37 U/L (ref 15–41)
Anion gap: 12 (ref 5–15)
BILIRUBIN TOTAL: 1.1 mg/dL (ref 0.3–1.2)
BUN: 11 mg/dL (ref 6–20)
CO2: 19 mmol/L — ABNORMAL LOW (ref 22–32)
CREATININE: 1.35 mg/dL — AB (ref 0.61–1.24)
Calcium: 10 mg/dL (ref 8.9–10.3)
Chloride: 108 mmol/L (ref 101–111)
GFR calc Af Amer: >60 mL/min (ref >60)
GFR calc non Af Amer: >60 mL/min (ref >60)
GLUCOSE: 96 mg/dL (ref 65–99)
POTASSIUM: 3.3 mmol/L — AB (ref 3.5–5.1)
Sodium: 139 mmol/L (ref 135–145)
TOTAL PROTEIN: 7.4 g/dL (ref 6.5–8.1)

## 2015-04-19 LAB — CK: CK TOTAL: 569 U/L — AB (ref 49–397)

## 2015-04-19 LAB — RAPID URINE DRUG SCREEN, HOSP PERFORMED
Amphetamines: POSITIVE — AB
Barbiturates: NOT DETECTED
Benzodiazepines: POSITIVE — AB
COCAINE: NOT DETECTED
Opiates: NOT DETECTED
Tetrahydrocannabinol: NOT DETECTED

## 2015-04-19 LAB — ETHANOL: Alcohol, Ethyl (B): <5 mg/dL (ref <5)

## 2015-04-19 LAB — TROPONIN I

## 2015-04-19 LAB — MAGNESIUM: Magnesium: 2.1 mg/dL (ref 1.7–2.4)

## 2015-04-19 LAB — TSH: TSH: 1.699 u[IU]/mL (ref 0.350–4.500)

## 2015-04-19 MED ORDER — ASPIRIN EC 81 MG PO TBEC: 81.0000 mg | DELAYED_RELEASE_TABLET | Freq: Every day | ORAL | Status: DC

## 2015-04-19 MED ORDER — METOPROLOL TARTRATE 1 MG/ML IV SOLN
5.0000 mg | Freq: Once | INTRAVENOUS | Status: AC
  Administered 2015-04-19: 5 mg via INTRAVENOUS
  Filled 2015-04-19: qty 5

## 2015-04-19 MED ORDER — DIAZEPAM 5 MG/ML IJ SOLN
5.0000 mg | Freq: Once | INTRAMUSCULAR | Status: AC
  Administered 2015-04-19: 5 mg via INTRAVENOUS
  Filled 2015-04-19: qty 2

## 2015-04-19 MED ORDER — ASPIRIN 325 MG PO TABS
325.0000 mg | ORAL_TABLET | Freq: Once | ORAL | Status: AC
  Administered 2015-04-19: 325 mg via ORAL
  Filled 2015-04-19: qty 1

## 2015-04-19 MED ORDER — ACETAMINOPHEN 650 MG RE SUPP: 650.0000 mg | Freq: Four times a day (QID) | RECTAL | Status: DC | PRN

## 2015-04-19 MED ORDER — LORAZEPAM 2 MG/ML IJ SOLN
1.0000 mg | Freq: Once | INTRAMUSCULAR | Status: DC
  Filled 2015-04-19: qty 1

## 2015-04-19 MED ORDER — POTASSIUM CHLORIDE CRYS ER 20 MEQ PO TBCR
40.0000 meq | EXTENDED_RELEASE_TABLET | Freq: Once | ORAL | Status: AC
  Administered 2015-04-19: 40 meq via ORAL
  Filled 2015-04-19: qty 2

## 2015-04-19 MED ORDER — ENOXAPARIN SODIUM 40 MG/0.4ML ~~LOC~~ SOLN
40.0000 mg | SUBCUTANEOUS | Status: DC
  Filled 2015-04-19: qty 0.4

## 2015-04-19 MED ORDER — ACETAMINOPHEN 325 MG PO TABS
650.0000 mg | ORAL_TABLET | Freq: Four times a day (QID) | ORAL | Status: DC | PRN
Start: 2015-04-19 — End: 2015-04-19

## 2015-04-19 MED ORDER — ELVITEG-COBIC-EMTRICIT-TENOFAF 150-150-200-10 MG PO TABS: 1.0000 | ORAL_TABLET | Freq: Every day | ORAL | Status: DC

## 2015-04-19 MED ORDER — LORAZEPAM 2 MG/ML IJ SOLN
1.0000 mg | Freq: Once | INTRAMUSCULAR | Status: AC
  Administered 2015-04-19: 1 mg via INTRAVENOUS
  Filled 2015-04-19: qty 1

## 2015-04-19 MED ORDER — POTASSIUM CHLORIDE CRYS ER 20 MEQ PO TBCR
40.0000 meq | EXTENDED_RELEASE_TABLET | Freq: Every day | ORAL | Status: DC
  Administered 2015-04-19: 40 meq via ORAL
  Filled 2015-04-19: qty 2

## 2015-04-19 NOTE — ED Notes (Signed)
Pt also reports CP

## 2015-04-19 NOTE — ED Notes (Signed)
EDP is aware of inc HR, RR, and pt reporting CP

## 2015-04-19 NOTE — ED Provider Notes (Signed)
CSN: [id number]     Arrival date & time 04/19/15  0052 History  This chart was scribed for Devoria Albe, MD by Evon Slack, ED Scribe. This patient was seen in room A09C/A09C and the patient's care was started at 01:57 AM.      Chief Complaint  Patient presents with  . Nausea  . Anxiety  . Chest Pain   Patient is a 34 y.o. male presenting with anxiety and chest pain. The history is provided by the patient. No language interpreter was used.  Anxiety Associated symptoms include chest pain and shortness of breath.  Chest Pain Associated symptoms: anxiety, cough, diaphoresis, dizziness, palpitations and shortness of breath   Associated symptoms: no nausea    HPI Comments: Douglas Dunn is a 34 y.o. male with PMHx of HIV who presents to the Emergency Department complaining of anxiety onset 2 hours prior. Pt states that he smoked marijuana tonight prior to having an anxiety attcak. He states that he has a Hx of anxiety after smoking marijuana. He states that he is also having some dull left sided CP, palpitations, SOB, cough and diaphoresis. He states he feels like his heart is racing. Pt states that when he normally has panic attacks they last for hours at a time. Pt states that when standing he feels dizzy. Pt states that he is followed by and has an appointment with the mood treatment center in [place] for his anxiety. They are in the process of finishing his evaluation and have not recommended medical treatment yet. Denies nausea or other related symptoms. Pt states that he smokes about .5 pack per day. Pt reports illicit drug use about 1-2x per month. Pt denies any methamphetamine use tonight.   PCP ID in [place] Dr Sharon Mt  Past Medical History  Diagnosis Date  . HIV (human immunodeficiency virus infection)    History reviewed. No pertinent past surgical history. No family history on file. Social History  Substance Use Topics  . Smoking status: Current Every Day Smoker    Types: Cigarettes  .  Smokeless tobacco: Never Used  . 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  Respiratory: Positive for cough and shortness of breath.   Cardiovascular: Positive for chest pain and palpitations.  Gastrointestinal: Negative for nausea.  Neurological: Positive for dizziness.  Psychiatric/Behavioral: The patient is nervous/anxious.   All other systems reviewed and are negative.     Allergies  Review of patient's allergies indicates no known allergies.  Home Medications   Prior to Admission medications   Medication Sig Start Date End Date Taking? Authorizing Provider  elvitegravir-cobicistat-emtricitabine-tenofovir (STRIBILD) 150-150-200-300 MG TABS tablet Take 1 tablet by mouth daily with breakfast.   Yes Historical Provider, MD   ED Triage Vitals  Enc Vitals Group     BP 04/19/15 0105 145/89 mmHg     Pulse Rate 04/19/15 0105 107     Resp 04/19/15 0105 16     Temp 04/19/15 0105 97.8 F (36.6 C)     Temp Source 04/19/15 0105 Oral     SpO2 04/19/15 0105 100 %     Weight 04/19/15 0105 186 lb (84.369 kg)     Height 04/19/15 0105  (1.854 m)     Head Cir --      Peak Flow --      Pain Score 04/19/15 0103 8     Pain Loc --  Pain Edu? --      Excl. in GC? --      Vital signs normal     Physical Exam  Constitutional: He is oriented to person, place, and time. He appears well-developed and well-nourished.  Non-toxic appearance. He does not appear ill. No distress.  HENT:  Head: Normocephalic and atraumatic.  Right Ear: External ear normal.  Left Ear: External ear normal.  Nose: Nose normal. No mucosal edema or rhinorrhea.  Mouth/Throat: Oropharynx is clear and moist and mucous membranes are normal. No dental abscesses or uvula swelling.  Eyes: Conjunctivae and EOM are normal. Pupils are equal, round, and reactive to light.  Neck: Normal range of motion and full passive range of motion  without pain. Neck supple.  Cardiovascular: Regular rhythm and normal heart sounds.  Tachycardia present.  Exam reveals no gallop and no friction rub.   No murmur heard. Pulmonary/Chest: Effort normal and breath sounds normal. No respiratory distress. He has no wheezes. He has no rhonchi. He has no rales. He exhibits no tenderness and no crepitus.  Abdominal: Soft. Normal appearance and bowel sounds are normal. He exhibits no distension. There is no tenderness. There is no rebound and no guarding.  Musculoskeletal: Normal range of motion. He exhibits no edema or tenderness.  Moves all extremities well.   Neurological: He is alert and oriented to person, place, and time. He has normal strength. No cranial nerve deficit.  Skin: Skin is warm, dry and intact. No rash noted. No erythema. No pallor.  Psychiatric: His speech is normal and behavior is normal. His mood appears anxious.  Nursing note and vitals reviewed.   ED Course  Procedures (including critical care time)  Medications  LORazepam (ATIVAN) injection 1 mg (0 mg Intravenous Hold 04/19/15 0438)  potassium chloride SA (K-DUR,KLOR-CON) CR tablet 40 mEq (not administered)  LORazepam (ATIVAN) injection 1 mg (1 mg Intravenous Given 04/19/15 0235)  metoprolol (LOPRESSOR) injection 5 mg (5 mg Intravenous Given 04/19/15 0437)  metoprolol (LOPRESSOR) injection 5 mg (5 mg Intravenous Given 04/19/15 0514)  metoprolol (LOPRESSOR) injection 5 mg (5 mg Intravenous Given 04/19/15 0714)  diazepam (VALIUM) injection 5 mg (5 mg Intravenous Given 04/19/15 0714)    DIAGNOSTIC STUDIES: Oxygen Saturation is 100% on RA, normal by my interpretation.    COORDINATION OF CARE: 2:01 AM-Discussed treatment plan with pt at bedside and pt agreed to plan. Patient seemed anxious when I first saw him. He was given Ativan 1 mg IV.  When I went back into see the patient at 4:15 AM he was noted to have a heart rate of 1:30 to 145 on the monitor. He is clutching his chest  stating "my heart is going to explode". He states he's having a hard time breathing. A second EKG was done. Patient was given metoprolol after reviewing his urine drug screen to make sure he had not had cocaine. His heart rate improved minimally. The Lopressor was repeated at 5:10 AM. His heart rate did improve to the 100-110 range. However at around 6:45 AM his heart rate came rapid again. Patient refused to have Ativan because he is afraid it will make his heart rate faster. He did consent to taking Valium. He is again clutching his chest stating he feels like his heart was going to explode.  08:08 Dr Darcel Smalling, [facility] will come see patient for admission, admit to tele attending Dr Erlinda Hong  08:15 pt rechecked, he is now smiling, HR is 98, states he is feeling better.  Labs Review Results for orders placed or performed during the hospital encounter of 04/19/15  Urine rapid drug screen (hosp performed)  Result Value Ref Range   Opiates NONE DETECTED NONE DETECTED   Cocaine NONE DETECTED NONE DETECTED   Benzodiazepines POSITIVE (A) NONE DETECTED   Amphetamines POSITIVE (A) NONE DETECTED   Tetrahydrocannabinol NONE DETECTED NONE DETECTED   Barbiturates NONE DETECTED NONE DETECTED  I-stat troponin, ED  Result Value Ref Range   Troponin i, poc 0.01 0.00 - 0.08 ng/mL   Comment 3          I-stat Chem 8, ED  Result Value Ref Range   Sodium 138 135 - 145 mmol/L   Potassium 3.3 (L) 3.5 - 5.1 mmol/L   Chloride 104 101 - 111 mmol/L   BUN 15 6 - 20 mg/dL   Creatinine, Ser 7.82 (H) 0.61 - 1.24 mg/dL   Glucose, Bld 95 65 - 99 mg/dL   Calcium, Ion 9.56 2.13 - 1.23 mmol/L   TCO2 19 0 - 100 mmol/L   Hemoglobin 13.6 13.0 - 17.0 g/dL   HCT 08.6 57.8 - 46.9 %   Laboratory interpretation all normal except positive UDS for ampetamine  and marijuana, mild hypokalemia, new renal insufficiency     Imaging Review No results found. I, Ward Givens, personally reviewed and evaluated these images and lab  results as part of my medical decision-making.   EKG Interpretation   Date/Time:  Tuesday April 19 2015 01:07:19 EDT Ventricular Rate:  108 PR Interval:  170 QRS Duration: 90 QT Interval:  315 QTC Calculation: 422 R Axis:   81 Text Interpretation:  Sinus tachycardia ST elev, probable normal early  repol pattern No significant change since last tracing 11 Apr 2015  Confirmed by Minard Millirons  MD-I, Jeb Schloemer ([id number]) on 04/19/2015 2:04:24 AM      EKG Interpretation  Date/Time:  Tuesday April 19 2015 04:25:56 EDT Ventricular Rate:  136 PR Interval:  107 QRS Duration: 93 QT Interval:  414 QTC Calculation: 623 R Axis:   82 Text Interpretation:  Sinus tachycardia Prolonged QT interval Since last tracing of earlier today HEART RATE has increased Confirmed by Kimberli Winne  MD-I, Larsen Dungan ([id number]) on 04/19/2015 5:01:04 AM        MDM   Final diagnoses:  Tachycardia  Amphetamine abuse  Anxiety  Precordial pain  Hypokalemia   Plan admission  Devoria Albe, MD, FACEP   I personally performed the services described in this documentation, which was scribed in my presence. The recorded information has been reviewed and considered.  Devoria Albe, MD, Concha Pyo, MD 04/19/15 [phone]

## 2015-04-19 NOTE — ED Notes (Signed)
EDP at bedside  

## 2015-04-19 NOTE — ED Notes (Signed)
EDP notified of inc in HR again. EDP currently at bedside

## 2015-04-19 NOTE — ED Notes (Signed)
Lab system is down at this time. Labs manually entered: NA 139 K 3.3 CL 108 CO2 19 CALC 10 ALB 4.3 BUN 11 CREAT 1.35 GLUC 96 TP 7.4 ALP 65 ALT 24 AST 37 TBIL 1.1 ANION GAP 12

## 2015-04-19 NOTE — ED Notes (Signed)
EDP notified of pt HR still being elevated, pt still refusing ativan even though pt stated he was irritable and anxious, stated that "ativan would kill him"
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
## 2015-04-19 NOTE — ED Notes (Signed)
Pt in from home, pt reports that he feels sick. Nausea, anxiety, and feels like his heart is racing.

## 2015-04-19 NOTE — Progress Notes (Signed)
Pt discharged to home, ambulatory, condition stable.

## 2015-04-20 LAB — T4: T4, Total: 5.6 ug/dL (ref 4.5–12.0)

## 2015-04-20 LAB — HEMOGLOBIN A1C
HEMOGLOBIN A1C: 4.9 % (ref 4.8–5.6)
Mean Plasma Glucose: 94 mg/dL
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
## 2015-05-28 ENCOUNTER — Emergency Department (HOSPITAL_COMMUNITY): Payer: BLUE CROSS/BLUE SHIELD

## 2015-05-28 ENCOUNTER — Emergency Department (HOSPITAL_COMMUNITY)
Admission: EM | Admit: 2015-05-28 | Discharge: 2015-05-28 | Disposition: A | Discharge status: Home / Self Care | Payer: BLUE CROSS/BLUE SHIELD | Attending: Emergency Medicine | Admitting: Emergency Medicine

## 2015-05-28 ENCOUNTER — Encounter (HOSPITAL_COMMUNITY): Payer: Self-pay | Admitting: Oncology

## 2015-05-28 DIAGNOSIS — R079 Chest pain, unspecified: Secondary | ICD-10-CM | POA: Diagnosis not present

## 2015-05-28 DIAGNOSIS — F1510 Other stimulant abuse, uncomplicated: Secondary | ICD-10-CM | POA: Insufficient documentation

## 2015-05-28 DIAGNOSIS — Z79899 Other long term (current) drug therapy: Secondary | ICD-10-CM | POA: Diagnosis not present

## 2015-05-28 DIAGNOSIS — R000 Tachycardia, unspecified: Secondary | ICD-10-CM | POA: Diagnosis not present

## 2015-05-28 DIAGNOSIS — Z720 Tobacco use: Secondary | ICD-10-CM | POA: Insufficient documentation

## 2015-05-28 DIAGNOSIS — Z21 Asymptomatic human immunodeficiency virus [HIV] infection status: Secondary | ICD-10-CM | POA: Insufficient documentation

## 2015-05-28 DIAGNOSIS — F419 Anxiety disorder, unspecified: Secondary | ICD-10-CM | POA: Diagnosis present

## 2015-05-28 LAB — I-STAT TROPONIN, ED: Troponin i, poc: 0.01 ng/mL (ref 0.00–0.08)

## 2015-05-28 MED ORDER — LORAZEPAM 2 MG/ML IJ SOLN
1.0000 mg | Freq: Once | INTRAMUSCULAR | Status: AC
  Administered 2015-05-28: 1 mg via INTRAMUSCULAR
  Filled 2015-05-28: qty 1

## 2015-05-28 MED ORDER — LORAZEPAM 1 MG PO TABS: 1.0000 mg | ORAL_TABLET | Freq: Three times a day (TID) | ORAL | Status: AC | PRN

## 2015-05-28 NOTE — ED Notes (Signed)
Pt did crystal meth at 0300.  Was seen at another facility and cleared.  Pt presents d/t same sx of racing heart, confusion and anxiety.  Pt reports using crystal meth for several years.

## 2015-05-28 NOTE — Discharge Instructions (Signed)
Stop using methamphetamine. Take Ativan as needed for persistent symptoms. Follow-up with your primary care doctor.  Stimulant Use Disorder-Amphetamines  Amphetamines are one of a group of powerful drugs known as stimulants. Amphetamines have a number of medical uses, including the treatment of a daytime sleepiness disorder due to narcolepsy or sleep apnea, attention deficit hyperactivity disorder, and chronic fatigue syndrome. However, amphetamines also are often misused because of the effects they produce. These effects include:  A feeling of extreme pleasure (euphoria).  Alertness.  Increased attention.  High energy.  Loss of appetite for weight loss. Common street names for these drugs include speed and crank. Amphetamines are taken by mouth, crushed and snorted, or dissolved in water and injected. Stimulants are addictive because they activate regions of the brain that are responsible for producing both the pleasurable sensation of "reward" and psychological dependence. Together, these actions account for loss of control and the rapid development of drug dependence. This means you will become ill without the drug (withdrawal) and need to keep using it to function.  Stimulant use disorder is use of stimulants that disrupts your daily life. It disrupts relationships with family and friends and how you do your job. Amphetamines increase blood pressure and heart rate. Use can lead to heart attack or stroke. Use can also cause death from irregular heart rate, seizures, or dangerously high body temperature. SIGNS AND SYMPTOMS  Symptoms of stimulant use disorder with amphetamines include:  Use of amphetamines in larger amounts or over a longer period than intended.  Unsuccessful attempts to cut down or control amphetamine use.  A lot of time spent obtaining, using, or recovering from the effects of amphetamines.  A strong desire or urge to use amphetamines (craving).  Continued use of  amphetamines in spite of major problems at work, school, or home because of use.  Continued use of amphetamines in spite of relationship problems because of use.  Giving up or cutting down on important life activities because of amphetamine use.  Use of amphetamines over and over in situations when it is physically hazardous, such as driving a car.  Continued use of amphetamines in spite of a physical problem that is likely related to amphetamine use. Physical problems can include:  Unintended weight loss.  High blood pressure.  Chest pain.  Infections such as human immunodeficiency virus and hepatitis (from injecting amphetamines).  Continued use of amphetamines in spite of mental problems that are likely related to use. Mental problems can include:  Anxiety.  Sleep problems.  Schizophrenia-like symptoms.  Depression.  Bipolar mood swings.  Violent behavior.  Need to use more and more amphetamines to get the same effect, or lessened effect over time with use of the same amount (tolerance).  Having withdrawal symptoms when amphetamine use is stopped, or using amphetamines to reduce or avoid withdrawal symptoms. Withdrawal symptoms include:  Depressed mood.  Low energy or restlessness.  Bad dreams.  Too little or too much sleep.  Increased appetite. DIAGNOSIS  Stimulant use disorder is diagnosed by your health care provider. You may be asked questions about your amphetamine use and how it affects your life. A physical exam may be done. A drug screen may be ordered. You may be referred to a mental health professional. The diagnosis of stimulant use disorder requires two or more symptoms within 12 months. The type of stimulant use disorder you have depends on the number of signs and symptoms you have. The type may be:  Mild. Two or three  signs and symptoms.  Moderate. Four or five signs and symptoms.  Severe. Six or more signs and symptoms. TREATMENT  The treatment  for most problems related to stimulant use disorder with amphetamines may be divided into two types:  Short-term medical treatment. This helps to preserve life and prevent or minimize damage from physical or mental problems related to use.  Long-term substance abuse treatment. This focuses on recovery from use disorder. It is provided by mental health professionals who have training in substance use disorders. It is usually a combination of counseling, support groups, and nonaddictive medicines that can reduce cravings or block the effects of amphetamines. HOME CARE INSTRUCTIONS   Take medicines only as directed by your health care provider.  Identify the people and activities that trigger your amphetamine use and avoid them.  Keep all follow-up visits as directed by your health care provider. SEEK MEDICAL CARE IF:  Your symptoms get worse or you relapse.  You are not able to take medicines as directed. SEEK IMMEDIATE MEDICAL CARE IF:   You have serious thoughts about hurting yourself or others.  You have a seizure, chest pain, sudden weakness, or loss of speech or vision. FOR MORE INFORMATION  National Institute on Drug Abuse: http://www.price-smith.com/  Substance Abuse and Mental Health Services Administration: SkateOasis.com.pt Document Released: 08/14/2001 Document Revised: 01/04/2014 Document Reviewed: 09/02/2013 [facility] Patient Information 2015 [place], [place]. This information is not intended to replace advice given to you by your health care provider. Make sure you discuss any questions you have with your health care provider.

## 2015-05-28 NOTE — ED Notes (Signed)
Patient complains of tachycardia since yesterday and was evaluated last night for the same thing.  Patient denies chest pain.

## 2015-05-28 NOTE — ED Provider Notes (Signed)
CSN: [id number]   Arrival date & time 05/28/15 2036  History  This chart was scribed for non-physician practitioner, Antony Madura, PA-C working with Derwood Kaplan, MD by Bethel Born, ED Scribe. This patient was seen in room WTR9/WTR9 and the patient's care was started at 10:32 PM.  Chief Complaint  Patient presents with  . Anxiety    HPI The history is provided by the patient. No language interpreter was used.   Douglas Dunn is a 34 y.o. male with PMHx of HIV and anxiety  who presents to the Emergency Department complaining of central chest pain with onset today. Pt describes the pain as burning/throbbing and rates it 7/10 in severity. He last used methamphetamines around 3 AM. He is not sure how much he used. Associated symptoms include racing palpitations. He was seen at [facility] today where he had lab work, a CT chest, and Ativan that helped his heart rate. He took nothing before coming to the ED today. Pt denies loss of consciousness after leaving [facility] and vomiting. PCP: Dr. Romeo Apple in [place]. He has someone to drive him home from the ED.   Past Medical History  Diagnosis Date  . HIV (human immunodeficiency virus infection)   . Anxiety     Past Surgical History  Procedure Laterality Date  . Wisdom tooth extraction      No family history on file.  Social History  Substance Use Topics  . Smoking status: Current Every Day Smoker -- 0.50 packs/day for 18 years    Types: Cigarettes  . Smokeless tobacco: Never Used  . Alcohol Use: No     Review of Systems  Constitutional: Negative for fever and chills.  Cardiovascular: Positive for chest pain and palpitations.  Gastrointestinal: Negative for nausea and vomiting.  Neurological: Negative for syncope and weakness.  All other systems reviewed and are negative.   Home Medications   Prior to Admission medications   Medication Sig Start Date End Date Taking? Authorizing Provider   elvitegravir-cobicistat-emtricitabine-tenofovir (STRIBILD) 150-150-200-300 MG TABS tablet Take 1 tablet by mouth daily with breakfast.    Historical Provider, MD  LORazepam (ATIVAN) 1 MG tablet Take 1 tablet (1 mg total) by mouth 3 (three) times daily as needed for anxiety. 05/28/15   Antony Madura, PA-C  potassium chloride SA (K-DUR,KLOR-CON) 20 MEQ tablet Take 1 tablet (20 mEq total) by mouth 2 (two) times daily. Patient not taking: Reported on 04/19/2015 04/12/15   Dione Booze, MD    Allergies  Review of patient's allergies indicates no known allergies.  Triage Vitals: BP 124/94 mmHg  Pulse 118  Temp(Src) 97.1 F (36.2 C) (Oral)  Resp 20  Ht  (1.854 m)  Wt 180 lb (81.647 kg)  BMI 23.75 kg/m2  SpO2 100%  Physical Exam  Constitutional: He is oriented to person, place, and time. He appears well-developed and well-nourished. No distress.  Nontoxic/nonseptic appearing  HENT:  Head: Normocephalic and atraumatic.  Eyes: Conjunctivae and EOM are normal. No scleral icterus.  Neck: Normal range of motion.  Cardiovascular: Regular rhythm.  Tachycardia present.   Pulmonary/Chest: Effort normal and breath sounds normal. No respiratory distress. He has no wheezes. He has no rales.  Respirations even and unlabored. Lungs clear.  Musculoskeletal: Normal range of motion.  Neurological: He is alert and oriented to person, place, and time. He exhibits normal muscle tone. Coordination normal.  Skin: Skin is warm and dry. No rash noted. He is not diaphoretic. No erythema. No pallor.  Psychiatric: He has a  normal mood and affect. His behavior is normal.  Nursing note and vitals reviewed.   ED Course  Procedures  DIAGNOSTIC STUDIES: Oxygen Saturation is 100% on RA,  normal by my interpretation.    COORDINATION OF CARE: 10:36 PM Discussed treatment plan which includes CXR, EKG, lab work, and Ativan with pt at bedside and pt agreed to the plan.  Labs Review-  Labs Reviewed  Rosezena Sensor,  ED    Imaging Review Dg Chest 2 View  05/28/2015   CLINICAL DATA:  Patient did crystal meth at 0300 hours. Was seen at another facility and cleared. Now begin presents with racing heart, confusion, and anxiety. Smoker.  EXAM: CHEST  2 VIEW  COMPARISON:  04/19/2015  FINDINGS: The heart size and mediastinal contours are within normal limits. Both lungs are clear. The visualized skeletal structures are unremarkable.  IMPRESSION: No active cardiopulmonary disease.   Electronically Signed   By: Burman Nieves M.D.   On: 05/28/2015 21:49     EKG Interpretation  Date/Time:  Saturday May 28 2015 21:36:42 EDT Ventricular Rate:  116 PR Interval:  170 QRS Duration: 92 QT Interval:  316 QTC Calculation: 439 R Axis:   82 Text Interpretation:  Sinus tachycardia Borderline T wave abnormalities No acute changes Confirmed by Rhunette Croft, MD, Janey Genta [id number]) on 05/28/2015 10:38:59 PM       MDM   Final diagnoses:  Tachycardia  Methamphetamine abuse    34 year old male presents to the emergency department for further evaluation of tachycardia after methamphetamine use. Patient states that he last used crystal meth at 3 AM today. Patient was seen and evaluated at [facility] emergency department for similar symptoms. He had a negative troponin at this time as well as a negative CT angiogram of the chest ruling out pulmonary embolus. Patient denies any methamphetamine use since he was at [facility] today. He continues to have a negative troponin. Doubt ACS as cause of tachycardia or chest discomfort today. Symptoms more likely secondary to the use of methamphetamines. There may be an underlying anxiety component as patient has a history of this. Patient given Ativan and will discharge with short course of same. Return precautions given at discharge. Patient discharged in good condition. He has a history of repeat ED visits for tachycardia following methamphetamine use/abuse.  I personally performed the services described  in this documentation, which was scribed in my presence. The recorded information has been reviewed and is accurate.       Antony Madura, PA-C 05/28/15 2251  Derwood Kaplan, MD 05/29/15 1524

## 2015-05-28 NOTE — ED Notes (Signed)
Discussed patient's HR at discharge with EDP Humes.  Okay to proceed with discharge as patient's Troponin was negative both here and at [facility] yesterday and his EKG was WNL.  Patient has hx of tachycardia and is not syncopal, SOB or hypotensive.

## 2015-07-04 ENCOUNTER — Emergency Department (HOSPITAL_COMMUNITY): Payer: BLUE CROSS/BLUE SHIELD

## 2015-07-04 ENCOUNTER — Encounter (HOSPITAL_COMMUNITY): Payer: Self-pay | Admitting: Emergency Medicine

## 2015-07-04 ENCOUNTER — Emergency Department (HOSPITAL_COMMUNITY)
Admission: EM | Admit: 2015-07-04 | Discharge: 2015-07-04 | Discharge status: Against Medical Advice | Payer: BLUE CROSS/BLUE SHIELD | Attending: Emergency Medicine | Admitting: Emergency Medicine

## 2015-07-04 DIAGNOSIS — B20 Human immunodeficiency virus [HIV] disease: Secondary | ICD-10-CM | POA: Diagnosis not present

## 2015-07-04 DIAGNOSIS — R7989 Other specified abnormal findings of blood chemistry: Secondary | ICD-10-CM | POA: Diagnosis not present

## 2015-07-04 DIAGNOSIS — F1510 Other stimulant abuse, uncomplicated: Secondary | ICD-10-CM | POA: Diagnosis not present

## 2015-07-04 DIAGNOSIS — F419 Anxiety disorder, unspecified: Secondary | ICD-10-CM | POA: Diagnosis not present

## 2015-07-04 DIAGNOSIS — Z720 Tobacco use: Secondary | ICD-10-CM | POA: Diagnosis not present

## 2015-07-04 DIAGNOSIS — R002 Palpitations: Secondary | ICD-10-CM | POA: Diagnosis not present

## 2015-07-04 DIAGNOSIS — R778 Other specified abnormalities of plasma proteins: Secondary | ICD-10-CM

## 2015-07-04 DIAGNOSIS — R000 Tachycardia, unspecified: Secondary | ICD-10-CM | POA: Diagnosis not present

## 2015-07-04 DIAGNOSIS — R079 Chest pain, unspecified: Secondary | ICD-10-CM | POA: Insufficient documentation

## 2015-07-04 DIAGNOSIS — Z79899 Other long term (current) drug therapy: Secondary | ICD-10-CM | POA: Insufficient documentation

## 2015-07-04 LAB — BASIC METABOLIC PANEL
Anion gap: 11 (ref 5–15)
BUN: 17 mg/dL (ref 6–20)
CO2: 23 mmol/L (ref 22–32)
Calcium: 10.2 mg/dL (ref 8.9–10.3)
Chloride: 103 mmol/L (ref 101–111)
Creatinine, Ser: 1.23 mg/dL (ref 0.61–1.24)
GFR calc Af Amer: >60 mL/min (ref >60)
GFR calc non Af Amer: >60 mL/min (ref >60)
Glucose, Bld: 101 mg/dL — ABNORMAL HIGH (ref 65–99)
Potassium: 3.3 mmol/L — ABNORMAL LOW (ref 3.5–5.1)
Sodium: 137 mmol/L (ref 135–145)

## 2015-07-04 LAB — CBC WITH DIFFERENTIAL/PLATELET
Basophils Absolute: 0.1 10*3/uL (ref 0.0–0.1)
Basophils Relative: 1 %
Eosinophils Absolute: 0 10*3/uL (ref 0.0–0.7)
Eosinophils Relative: 0 %
HCT: 39.5 % (ref 39.0–52.0)
Hemoglobin: 13.8 g/dL (ref 13.0–17.0)
Lymphocytes Relative: 14 %
Lymphs Abs: 1.2 10*3/uL (ref 0.7–4.0)
MCH: 32.2 pg (ref 26.0–34.0)
MCHC: 34.9 g/dL (ref 30.0–36.0)
MCV: 92.3 fL (ref 78.0–100.0)
Monocytes Absolute: 0.9 10*3/uL (ref 0.1–1.0)
Monocytes Relative: 10 %
Neutro Abs: 6.4 10*3/uL (ref 1.7–7.7)
Neutrophils Relative %: 75 %
Platelets: 314 10*3/uL (ref 150–400)
RBC: 4.28 MIL/uL (ref 4.22–5.81)
RDW: 11.8 % (ref 11.5–15.5)
WBC: 8.5 10*3/uL (ref 4.0–10.5)

## 2015-07-04 LAB — TROPONIN I
Troponin I: 0.05 ng/mL — ABNORMAL HIGH (ref <0.031)
Troponin I: 0.12 ng/mL — ABNORMAL HIGH (ref <0.031)

## 2015-07-04 MED ORDER — DIAZEPAM 5 MG/ML IJ SOLN
5.0000 mg | Freq: Once | INTRAMUSCULAR | Status: AC
  Administered 2015-07-04: 5 mg via INTRAVENOUS
  Filled 2015-07-04: qty 2

## 2015-07-04 MED ORDER — ASPIRIN 81 MG PO CHEW
324.0000 mg | CHEWABLE_TABLET | Freq: Once | ORAL | Status: AC
  Administered 2015-07-04: 324 mg via ORAL
  Filled 2015-07-04: qty 4

## 2015-07-04 MED ORDER — POTASSIUM CHLORIDE CRYS ER 20 MEQ PO TBCR
20.0000 meq | EXTENDED_RELEASE_TABLET | Freq: Once | ORAL | Status: AC
  Administered 2015-07-04: 20 meq via ORAL
  Filled 2015-07-04: qty 1

## 2015-07-04 MED ORDER — SODIUM CHLORIDE 0.9 % IV BOLUS (SEPSIS)
1000.0000 mL | Freq: Once | INTRAVENOUS | Status: AC
  Administered 2015-07-04: 1000 mL via INTRAVENOUS

## 2015-07-04 MED ORDER — DIAZEPAM 5 MG/ML IJ SOLN
7.5000 mg | Freq: Once | INTRAMUSCULAR | Status: AC
  Administered 2015-07-04: 7.5 mg via INTRAMUSCULAR
  Filled 2015-07-04: qty 2

## 2015-07-04 NOTE — ED Notes (Signed)
MD at bedside. 

## 2015-07-04 NOTE — ED Provider Notes (Signed)
CSN: [id number]     Arrival date & time 07/04/15  1449 History   First MD Initiated Contact with Patient 07/04/15 1515     Chief Complaint  Patient presents with  . Tachycardia  . Chest Pain  . Drug Problem     (Consider location/radiation/quality/duration/timing/severity/associated sxs/prior Treatment) HPI   34 year old male presenting with palpitations, feeling of anxiety & chest pain. Patient reports injecting "crystal meth" approximately 1 hour prior to arrival. Apologetic and feels embarrassed. Shortly after injection he began feel like his heart was racing. Tightness in his chest. Feels anxious and an impending sense that something bad is going to happen. Patient reports similar type symptoms with previous amphetamine use, but "not this bad."  Shortness of breath. Denies any other ingestion or drug use aside from the aforementioned. No fevers or chills. No cough. No symptoms prior to injecting the meth earlier today. Past history of HIV followed by infectious disease to [facility]. Reports compliance with his medications.  Past Medical History  Diagnosis Date  . HIV (human immunodeficiency virus infection) (HCC)   . Anxiety    Past Surgical History  Procedure Laterality Date  . Wisdom tooth extraction     History reviewed. No pertinent family history. Social History  Substance Use Topics  . Smoking status: Current Every Day Smoker -- 0.50 packs/day for 18 years    Types: Cigarettes  . Smokeless tobacco: Never Used  . Alcohol Use: No    Review of Systems  All systems reviewed and negative, other than as noted in HPI.   Allergies  Review of patient's allergies indicates no known allergies.  Home Medications   Prior to Admission medications   Medication Sig Start Date End Date Taking? Authorizing Provider  elvitegravir-cobicistat-emtricitabine-tenofovir (STRIBILD) 150-150-200-300 MG TABS tablet Take 1 tablet by mouth daily with breakfast.    Historical Provider, MD   LORazepam (ATIVAN) 1 MG tablet Take 1 tablet (1 mg total) by mouth 3 (three) times daily as needed for anxiety. 05/28/15   Antony Madura, PA-C  potassium chloride SA (K-DUR,KLOR-CON) 20 MEQ tablet Take 1 tablet (20 mEq total) by mouth 2 (two) times daily. Patient not taking: Reported on 04/19/2015 04/12/15   Dione Booze, MD   BP 139/79 mmHg  Pulse 129  Temp(Src) 98 F (36.7 C) (Axillary)  Resp 17  SpO2 100% Physical Exam  Constitutional: He appears well-developed and well-nourished. No distress.  HENT:  Head: Normocephalic and atraumatic.  Eyes: Conjunctivae are normal. Right eye exhibits no discharge. Left eye exhibits no discharge.  Neck: Neck supple.  Cardiovascular: Regular rhythm and normal heart sounds.  Exam reveals no gallop and no friction rub.   No murmur heard. Tachycardia  Pulmonary/Chest: Effort normal and breath sounds normal. No respiratory distress.  Abdominal: Soft. He exhibits no distension. There is no tenderness.  Musculoskeletal: He exhibits no edema or tenderness.  Lower extremities symmetric as compared to each other. No calf tenderness. Negative Homan's. No palpable cords.   Neurological: He is alert.  Skin: Skin is warm and dry.  Psychiatric: He has a normal mood and affect. His behavior is normal. Thought content normal.  Nursing note and vitals reviewed.   ED Course  Procedures (including critical care time) Labs Review Labs Reviewed  BASIC METABOLIC PANEL - Abnormal; Notable for the following:    Potassium 3.3 (*)    Glucose, Bld 101 (*)    All other components within normal limits  TROPONIN I - Abnormal; Notable for the following:  Troponin I 0.05 (*)    All other components within normal limits  CBC WITH DIFFERENTIAL/PLATELET    Imaging Review No results found. I have personally reviewed and evaluated these images and lab results as part of my medical decision-making.   EKG Interpretation   Date/Time:  Monday July 04 2015 14:58:42  EDT Ventricular Rate:  151 PR Interval:  111 QRS Duration: 91 QT Interval:  260 QTC Calculation: 412 R Axis:   85 Text Interpretation:  Sinus tachycardia Non-specific ST-t changes  Confirmed by Juleen ChinaKOHUT  MD, Leenah Seidner ([id number]) on 07/04/2015 3:16:13 PM      MDM   Final diagnoses:  Methamphetamine abuse  Chest pain, unspecified chest pain type  Troponin level elevated    34 year old male with palpitations, anxiety and chest pain. Symptoms likely secondary to methamphetamine abuse. Per review of records, he has been seen previously for similar type symptoms after amphetamine usage. Resting heart rate currently 120 to 130. Sinus tach. Mild hypertension. Seems anxious otherwise in no acute distress. PRN benzos and observe. Consider alternative etiologies such as ACS, PE, dissection or other emergent process but clinically I currently have a low suspicion for these. Will obtain screening labs.  Pt continues with sinus tachycardia which is not unexpected. Continue PRN benzos. Minimal elevation in troponin. Suspect because of sustained tachycardia. ASA given though. No known CAD. Repeat EKG similar to initial. Will discuss with medicine for admission for continued symptomatic tx and to trend enzymes.   Pt unwilling to be admitted. Has medical decision making capability. Understands benefit of admission to the hospital. Understands risk of discharge including but not limited to permanent disability or death.   Raeford RazorStephen Roddrick Sharron, MD 07/10/15 2105

## 2015-07-04 NOTE — ED Notes (Signed)
Pt states he injected crystal meth about an hour ago. Now having chest tightness, tachycardia at 155, SOB, weakness, lightheadedness.

## 2015-07-04 NOTE — ED Notes (Signed)
Took out #20 Piv out of patients right lower forearm.

## 2015-07-04 NOTE — ED Notes (Signed)
Bed: WA06 Expected date:  Expected time:  Means of arrival:  Comments: Tr 2

## 2015-07-04 NOTE — ED Notes (Signed)
Patient transported to X-ray 

## 2015-07-04 NOTE — ED Notes (Signed)
Hospitalist at bedside 

## 2015-07-04 NOTE — Discharge Instructions (Signed)
°Emergency Department Resource Guide °1) Find a Doctor and Pay Out of Pocket °Although you won't have to find out who is covered by your insurance plan, it is a good idea to ask around and get recommendations. You will then need to call the office and see if the doctor you have chosen will accept you as a new patient and what types of options they offer for patients who are self-pay. Some doctors offer discounts or will set up payment plans for their patients who do not have insurance, but you will need to ask so you aren't surprised when you get to your appointment. ° °2) Contact Your Local Health Department °Not all health departments have doctors that can see patients for sick visits, but many do, so it is worth a call to see if yours does. If you don't know where your local health department is, you can check in your phone book. The CDC also has a tool to help you locate your state's health department, and many state websites also have listings of all of their local health departments. ° °3) Find a Walk-in Clinic °If your illness is not likely to be very severe or complicated, you may want to try a walk in clinic. These are popping up all over the country in pharmacies, drugstores, and shopping centers. They're usually staffed by nurse practitioners or physician assistants that have been trained to treat common illnesses and complaints. They're usually fairly quick and inexpensive. However, if you have serious medical issues or chronic medical problems, these are probably not your best option. ° °No Primary Care Doctor: °- Call [facility] at  832-8000 - they can help you locate a primary care doctor that  accepts your insurance, provides certain services, etc. °- Physician Referral Service- 1-800-533-3463 ° °Chronic Pain Problems: °Organization         Address  Phone   Notes  °[facility]  (336) 297-2271 Patients need to be referred by their primary care doctor.  ° °Medication  Assistance: °Organization         Address  Phone   Notes  °[place] [facility] [address] °[place], [place] [address] (336) 641-8030 --Must be a resident of [place] °-- Must have NO insurance coverage whatsoever (no Medicaid/ Medicare, etc.) °-- The pt. MUST have a primary care doctor that directs their care regularly and follows them in the community °  °[facility]  (866) 331-1348   °[employer]  (888) 892-1162   ° °Agencies that provide inexpensive medical care: °Organization         Address  Phone   Notes  °[facility]  (336) 832-8035   °[facility]    (336) 832-7272   °[facility] [address] °[place], [place] [address] (336) 832-4777   °[facility] [address], °[place] (336) 271-4999   °[facility]    (336) 373-0678   °[facility]    (336) 272-1050   °[facility] ° [address], [place] Phone:  (336) 832-4444, Fax:  (336) 832-4440 Hours of Operation:  9 am - 6 pm, M-F.  Also accepts Medicaid/Medicare and self-pay.  ° [facility] ° [address], [place] Phone: (336) 832-3150, Fax: (336) 832-3151. Hours of Operation:  8:30 am - 5:30 pm, M-F.  Also accepts Medicaid and self-pay.  °[facility] [place] [address], [place] Phone: (336) 878-6027   °[facility] [address], [place], [place] (336)723-1848, Ext. 123 Mondays & Thursdays: 7-9 AM.  First 15 patients are seen on a first come, first serve basis. °  ° °Medicaid-accepting [place] Providers: ° °Organization         Address  Phone   Notes  °[facility] [address], [place] (336) 641-2100 Also accepts self-pay patients.  °[facility] [address], [place] ° (336) 856-9996   °[facility] [address], [place]  (336) 288-8857   °[facility] [address] [place] [address], [place] (336) 299-7000   °Veita Bland [address], [place]  ° (336) 373-1557 Only accepts [place] [facility] Medicaid patients after they have their name applied to their card.  ° °Self-Pay (no insurance) in [place]: ° °Organization         Address  Phone   Notes  °Sickle Cell Patients, [facility] [address], [place] (336) 832-1970   °[facility] [address], [place] (336) 832-4400   °[facility] ° [address], [place] (336) 992-4800   °[facility]/Dr. Osei-Bonsu ° [address] [place] [address], [place] or [address], [place] (336) 841-8500 Phone number for both [place] and [place] locations is the same.  °[facility] [address], [place] (336) 299-0000   °[facility] [address] [place] [address], [place] or [address] (336) 852-7530 °(336) 878-2260   °[facility] [address],  (336) 350-1642, phone; (336) 294-5005, fax Sees patients 1st and 3rd Saturday of every month.  Must not qualify for public or private insurance (i.e. Medicaid, Medicare, [place] Health Choice, Veterans' Benefits) • Household income should be no more than 200% of the poverty level •The clinic cannot treat you if you are pregnant or think you are pregnant • Sexually transmitted diseases are not treated at the clinic.  ° ° °Dental Care: °Organization         Address  Phone  Notes  °[place] [facility] [address],  (336) 641-6152 Accepts children up to age 21 who are enrolled in Medicaid or [place] Health Choice; pregnant women with a Medicaid card; and children who have applied for Medicaid or [place] Health Choice, but were declined, whose parents can pay a reduced fee at time of service.  °[place]  [facility] [place]  [address], [place] (336) 641-7733 Accepts children up to age 21 who are enrolled in Medicaid or [place] Health Choice; pregnant women with a Medicaid card; and children who have applied for Medicaid or [place] Health Choice, but were declined, whose parents can pay a reduced fee at time of service.  °[facility] ° [address],  (336) 641-4533 Patients are seen by appointment only. Walk-ins are not accepted. [facility] will see patients 18 years of age and older. °Monday - Tuesday (8am-5pm) °Most Wednesdays (8:30-5pm) °$30 per visit, cash only  °[facility] ° [address], [place] (336) 641-4533 Patients are seen by appointment only. Walk-ins are not accepted. [facility] will see patients 18 years of age and older. °One   Wednesday Evening (Monthly: Volunteer Based).  $30 per visit, cash only  °[facility]  (919) 537-3737 for adults; Children under age 4, call Graduate Pediatric Dentistry at (919) [phone]. Children aged 4-14, please call (919) [phone] to request a pediatric application. ° Dental services are provided in all areas of dental care including fillings, crowns and bridges, complete and partial dentures, implants, gum treatment, root canals, and extractions. Preventive care is also provided. Treatment is provided to both adults and children. °Patients are selected via a lottery and there is often a waiting list. °  °[facility] [address], °[place] ° (336) 763-8833 [email or web address] °  °[facility] [address], [place], [place] (336)723-1848, Ext. 123 Second and Fourth Thursday of each month, opens at 6:30 AM; Clinic ends at 9 AM.  Patients are seen on a first-come first-served basis, and a limited number are seen during each clinic.  ° °[facility] ° [address], [place], [place] (336) 723-7904    Eligibility Requirements °You must have lived in [place], [place], or [place] counties for at least the last three months. °  You cannot be eligible for state or federal sponsored healthcare insurance, including Veterans Administration, Medicaid, or Medicare. °  You generally cannot be eligible for healthcare insurance through your employer.  °  How to apply: °Eligibility screenings are held every Tuesday and Wednesday afternoon from 1:00 pm until 4:00 pm. You do not need an appointment for the interview!  °[facility] [address], [place], [place] 336-631-2330   °[facility] [facility]  336-342-8273   °[facility]  336-703-3100   °[facility]  336-570-6415   ° °Behavioral Health Resources in the Community: °Intensive Outpatient Programs °Organization         Address  Phone  Notes  °[facility] [address], [place], [place] 336-878-6098   °[facility] [address], [place], [place] 336-832-9800   °[facility] [address], [place], [place] ° 336-882-2125   °[facility] [address],  °[place], [place] 1-800-853-5163 or 336-641-4981   °Substance Abuse Resources °Organization         Address  Phone  Notes  °[facility]  336-882-2125   °[facility]  336-784-9470   °[facility]  336-285-9073   °[facility]  336-845-3988   °Residential & Outpatient Substance Abuse Program  1-800-659-3381   °Psychological Services °Organization         Address  Phone  Notes  °[facility]  336- 832-9600   °[facility]  336- 378-7881   °[facility] [address], [place] 1-800-853-5163 or 336-641-4981   ° °Mobile Crisis Teams °Organization         Address  Phone  Notes  °[facility], Mobile Crisis Care Unit  1-877-626-1772   °Assertive °[facility] ° [address]  [place], [place] 336-834-9664   °Sharon DeEsch [address] °[place] 336-554-5454   ° °Self-Help/Support Groups °Organization         Address  Phone             Notes  °[facility] [place] - variety of support groups  336- 373-1402 Call for more information  °Narcotics Anonymous (NA), [facility] [address], °[place]   2 meetings at this location  ° °  Residential Treatment Programs °Organization         Address  Phone  Notes  °[facility] [address],    °[place]  1-866-801-8205   °[facility] ° [address], [place], [place] 704-293-8524   °[facility] [address], [place] 336-845-3988 Admissions: 8am-3pm M-F  °[facility] [address]    °[place], [place] 336-841-1104   °[facility] [address], [place], [place] 336-379-7146   °[facility] [address]  °[place], [place] 336-285-9073   °[facility] - Intensive Outpatient [address], [place], [place] 336-852-3033   °[facility] [address]  °[place], [place] 1-877-615-2722 or 336-784-9470   °[facility] [address] [place], [place] 336-227-7417 Accepts Medicaid  °[facility] [address]  °[place] 1-800-659-3381 Substance Abuse/Addiction Treatment  ° °[place] Behavioral Health Resources °Organization         Address  Phone  Notes  °[facility]  (888) 581-9988   °Julie Brannon, PhD [address] [place], [place]   (336) 349-5553 or (336) 951-0000   °[facility]   [address] °[place], [place] (336) 349-4454   °[facility] [address], [place], [place] (336) 342-8316 Insurance/Medicaid/sponsorship through [facility]  °[facility] [address]                                    [place], [place] (336) 342-8316 Therapy/tele-psych/case    °[facility] [address]  ° [place],  (336) 349-2233    °Dr. Arfeen  (336) 349-4544   °[facility] [place]  [employer] [place] [facility] 1) [address],  °2) [address], [place] °3)  [address], [place] (336) 349-3220 °(336) 342-7768 ° °(336) 342-8140   °[place] [facility] (336) 342-1394 or (336) 342-3537 (After Hours)    ° ° °

## 2015-07-04 NOTE — ED Notes (Signed)
Dr. Juleen ChinaKohut made aware of patient's heart rate.

## 2015-07-04 NOTE — Progress Notes (Signed)
Patient ID: Douglas Dunn, male   DOB: 17-Jul-1981, 34 y.o.   MRN: [id number]  The patient declined to be admitted at this time. He states that he is not sure about whether he will stay or not because he has to go to work at 11 PM. I told the patient that he might be asked to signed an AMA form by the emergency department staff, if he is not willing to be admitted. He stated that he will know by 9 PM. I will proceed to admit him at that time if he is agreeable to do so. I notified his nurse and Dr. Juleen ChinaKohut about the patient's disposition at this time.

## 2015-10-01 ENCOUNTER — Emergency Department (HOSPITAL_COMMUNITY): Payer: BLUE CROSS/BLUE SHIELD

## 2015-10-01 ENCOUNTER — Encounter (HOSPITAL_COMMUNITY): Payer: Self-pay | Admitting: Emergency Medicine

## 2015-10-01 ENCOUNTER — Emergency Department (HOSPITAL_COMMUNITY)
Admission: EM | Admit: 2015-10-01 | Discharge: 2015-10-01 | Disposition: A | Discharge status: Home / Self Care | Payer: BLUE CROSS/BLUE SHIELD | Attending: Emergency Medicine | Admitting: Emergency Medicine

## 2015-10-01 DIAGNOSIS — F17210 Nicotine dependence, cigarettes, uncomplicated: Secondary | ICD-10-CM | POA: Diagnosis not present

## 2015-10-01 DIAGNOSIS — R1013 Epigastric pain: Secondary | ICD-10-CM | POA: Insufficient documentation

## 2015-10-01 DIAGNOSIS — F419 Anxiety disorder, unspecified: Secondary | ICD-10-CM | POA: Diagnosis not present

## 2015-10-01 DIAGNOSIS — R000 Tachycardia, unspecified: Secondary | ICD-10-CM | POA: Insufficient documentation

## 2015-10-01 DIAGNOSIS — Z79899 Other long term (current) drug therapy: Secondary | ICD-10-CM | POA: Insufficient documentation

## 2015-10-01 DIAGNOSIS — R0789 Other chest pain: Secondary | ICD-10-CM | POA: Insufficient documentation

## 2015-10-01 DIAGNOSIS — R079 Chest pain, unspecified: Secondary | ICD-10-CM | POA: Diagnosis present

## 2015-10-01 DIAGNOSIS — F1520 Other stimulant dependence, uncomplicated: Secondary | ICD-10-CM | POA: Insufficient documentation

## 2015-10-01 DIAGNOSIS — B20 Human immunodeficiency virus [HIV] disease: Secondary | ICD-10-CM | POA: Diagnosis not present

## 2015-10-01 HISTORY — DX: Other psychoactive substance abuse, uncomplicated: F19.10

## 2015-10-01 LAB — I-STAT TROPONIN, ED: TROPONIN I, POC: 0.01 ng/mL (ref 0.00–0.08)

## 2015-10-01 LAB — BASIC METABOLIC PANEL
ANION GAP: 14 (ref 5–15)
BUN: 14 mg/dL (ref 6–20)
CHLORIDE: 102 mmol/L (ref 101–111)
CO2: 21 mmol/L — AB (ref 22–32)
Calcium: 10.1 mg/dL (ref 8.9–10.3)
Creatinine, Ser: 1.4 mg/dL — ABNORMAL HIGH (ref 0.61–1.24)
GFR calc non Af Amer: >60 mL/min (ref >60)
Glucose, Bld: 121 mg/dL — ABNORMAL HIGH (ref 65–99)
Potassium: 4 mmol/L (ref 3.5–5.1)
Sodium: 137 mmol/L (ref 135–145)

## 2015-10-01 LAB — CBC
HCT: 40.6 % (ref 39.0–52.0)
HEMOGLOBIN: 13.9 g/dL (ref 13.0–17.0)
MCH: 31.7 pg (ref 26.0–34.0)
MCHC: 34.2 g/dL (ref 30.0–36.0)
MCV: 92.7 fL (ref 78.0–100.0)
Platelets: 325 10*3/uL (ref 150–400)
RBC: 4.38 MIL/uL (ref 4.22–5.81)
RDW: 12.4 % (ref 11.5–15.5)
WBC: 10.4 10*3/uL (ref 4.0–10.5)

## 2015-10-01 MED ORDER — LORAZEPAM 2 MG/ML IJ SOLN
1.0000 mg | Freq: Once | INTRAMUSCULAR | Status: AC
  Administered 2015-10-01: 1 mg via INTRAVENOUS
  Filled 2015-10-01: qty 1

## 2015-10-01 MED ORDER — SODIUM CHLORIDE 0.9 % IV BOLUS (SEPSIS)
1000.0000 mL | Freq: Once | INTRAVENOUS | Status: AC
  Administered 2015-10-01: 1000 mL via INTRAVENOUS

## 2015-10-01 NOTE — ED Provider Notes (Signed)
CSN: [id number]     Arrival date & time 10/01/15  1557 History   First MD Initiated Contact with Patient 10/01/15 1615     Chief Complaint  Patient presents with  . Chest Pain  . Addiction Problem  . Tachycardia     (Consider location/radiation/quality/duration/timing/severity/associated sxs/prior Treatment) HPI Patient presents with concern of palpitations, epigastric discomfort, sense of impending doom. 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  Patient has not used this in almost 1 month, denies other ingestants or illicit substances today. Patient injected intravenously, left arm. He has had persistent discomfort since using the crystal meth. No syncope, no dyspnea and no vomiting, no diarrhea. Patient denies history of depression, anxiety, suicidal thoughts, homicidal thoughts. He acknowledges a history of HIV States he takes all medication as directed.  Past Medical History  Diagnosis Date  . HIV (human immunodeficiency virus infection) (HCC)   . Anxiety   . Drug abuse    Past Surgical History  Procedure Laterality Date  . Wisdom tooth extraction     No family history on file. Social History  Substance Use Topics  . Smoking status: Current Every Day Smoker -- 0.50 packs/day for 18 years    Types: Cigarettes  . Smokeless tobacco: Never Used  . Alcohol Use: No    Review of Systems  Constitutional:       Per HPI, otherwise negative  HENT:       Per HPI, otherwise negative  Respiratory:       Per HPI, otherwise negative  Cardiovascular:       Per HPI, otherwise negative  Gastrointestinal: Negative for vomiting.  Endocrine:       Negative aside from HPI  Genitourinary:       Neg aside from HPI   Musculoskeletal:       Per HPI, otherwise negative  Skin: Negative.   Allergic/Immunologic: Positive for immunocompromised state.  Neurological: Negative for syncope.      Allergies  Review of patient's allergies  indicates no known allergies.  Home Medications   Prior to Admission medications   Medication Sig Start Date End Date Taking? Authorizing Provider  elvitegravir-cobicistat-emtricitabine-tenofovir (STRIBILD) 150-150-200-300 MG TABS tablet Take 1 tablet by mouth daily with breakfast.    Historical Provider, MD  LORazepam (ATIVAN) 1 MG tablet Take 1 tablet (1 mg total) by mouth 3 (three) times daily as needed for anxiety. 05/28/15   Antony Madura, PA-C  potassium chloride SA (K-DUR,KLOR-CON) 20 MEQ tablet Take 1 tablet (20 mEq total) by mouth 2 (two) times daily. Patient not taking: Reported on 04/19/2015 04/12/15   Dione Booze, MD   BP 151/93 mmHg  Pulse 126  Temp(Src) 98.8 F (37.1 C) (Oral)  Resp 16  Ht  (1.854 m)  Wt 180 lb (81.647 kg)  BMI 23.75 kg/m2  SpO2 100% Physical Exam  Constitutional: He is oriented to person, place, and time. He appears well-developed. No distress.  HENT:  Head: Normocephalic and atraumatic.  Eyes: Conjunctivae and EOM are normal.  Cardiovascular: Regular rhythm.  Tachycardia present.   Pulmonary/Chest: Effort normal. No stridor. No respiratory distress.  Abdominal: He exhibits no distension.  Musculoskeletal: He exhibits no edema.  Neurological: He is alert and oriented to person, place, and time.  Skin: Skin is warm and dry.  Psychiatric: His mood appears anxious.  Nursing note and vitals reviewed.   ED Course  Procedures (including critical care time) Labs Review Labs Reviewed  BASIC METABOLIC PANEL -  Abnormal; Notable for the following:    CO2 21 (*)    Glucose, Bld 121 (*)    Creatinine, Ser 1.40 (*)    All other components within normal limits  CBC  I-STAT TROPOININ, ED    Imaging Review Dg Chest 2 View  10/01/2015  CLINICAL DATA:  Acute onset left-sided chest pain approximately 30 minutes ago. HIV. Prolonged QT interval on EKG. EXAM: CHEST  2 VIEW COMPARISON:  07/04/2015 FINDINGS: The heart size and mediastinal contours are within  normal limits. Both lungs are clear. The visualized skeletal structures are unremarkable. IMPRESSION: No active cardiopulmonary disease. Electronically Signed   By: Myles Rosenthal M.D.   On: 10/01/2015 16:26   I have personally reviewed and evaluated these images and lab results as part of my medical decision-making.   EKG Interpretation   Date/Time:  Saturday October 01 2015 16:01:50 EST Ventricular Rate:  157 PR Interval:  142 QRS Duration: 84 QT Interval:  314 QTC Calculation: 507 R Axis:   82 Text Interpretation:  Sinus tachycardia Otherwise normal ECG Sinus  tachycardia or aflutter -similar to study on 07/20/2013 Abnormal ekg  Confirmed by Gerhard Munch  MD [id number]) on 10/01/2015 4:16:33 PM     On physical exam, the patient's heart rate is decreasing, 125, sinus tach abnormal After the initial evaluation the patient received fluids, Ativan.   6:56 PM HR 115, patient slightly more comfortable.   7:40 PM HR <95.  Patient appears substantially more comfortable, and states that he feels better as well.   MDM  Young male presents with relatively sudden onset of chest pain, anxiousness, after using crystal methamphetamine. Initially the patient is tachycardic, tachypneic, uncomfortable. However, the patient has reassuring labs, and after hours of monitoring, fluid resuscitation, multiple doses of benzodiazepine, the patient returns to normal, with no persistent tachycardia, tachypnea, no ongoing complaints. We discussed the need to maintain distance from crystal methamphetamine, and the patient was discharged in stable condition.  Gerhard Munch, MD 10/01/15 1942

## 2015-10-01 NOTE — ED Notes (Signed)
Pt reports doing crystal meth prior to arrival. Pt reports chest pain and shortness of breath. Pt heart rate is 155 in triage.

## 2015-10-01 NOTE — Discharge Instructions (Signed)
As discussed, it is very important that you abstain from using substances that provoke your episodes of chest pain.  Please be sure to follow-up with your primary care provider.  Return here for concerning changes in your condition.

## 2016-11-01 ENCOUNTER — Emergency Department (HOSPITAL_COMMUNITY)
Admission: EM | Admit: 2016-11-01 | Discharge: 2016-11-01 | Discharge status: Against Medical Advice | Payer: Self-pay | Attending: Emergency Medicine | Admitting: Emergency Medicine

## 2016-11-01 ENCOUNTER — Emergency Department (HOSPITAL_COMMUNITY): Payer: Self-pay

## 2016-11-01 ENCOUNTER — Encounter (HOSPITAL_COMMUNITY): Payer: Self-pay | Admitting: Emergency Medicine

## 2016-11-01 DIAGNOSIS — F17210 Nicotine dependence, cigarettes, uncomplicated: Secondary | ICD-10-CM | POA: Insufficient documentation

## 2016-11-01 DIAGNOSIS — E876 Hypokalemia: Secondary | ICD-10-CM | POA: Insufficient documentation

## 2016-11-01 DIAGNOSIS — Z79899 Other long term (current) drug therapy: Secondary | ICD-10-CM | POA: Insufficient documentation

## 2016-11-01 DIAGNOSIS — F1510 Other stimulant abuse, uncomplicated: Secondary | ICD-10-CM | POA: Insufficient documentation

## 2016-11-01 LAB — BASIC METABOLIC PANEL
Anion gap: 15 (ref 5–15)
BUN: 13 mg/dL (ref 6–20)
CHLORIDE: 102 mmol/L (ref 101–111)
CO2: 21 mmol/L — AB (ref 22–32)
CREATININE: 1.06 mg/dL (ref 0.61–1.24)
Calcium: 10 mg/dL (ref 8.9–10.3)
GFR calc Af Amer: >60 mL/min (ref >60)
GFR calc non Af Amer: >60 mL/min (ref >60)
GLUCOSE: 93 mg/dL (ref 65–99)
POTASSIUM: 3.1 mmol/L — AB (ref 3.5–5.1)
Sodium: 138 mmol/L (ref 135–145)

## 2016-11-01 LAB — CBC
HCT: 38.1 % — ABNORMAL LOW (ref 39.0–52.0)
Hemoglobin: 14.1 g/dL (ref 13.0–17.0)
MCH: 33.7 pg (ref 26.0–34.0)
MCHC: 37 g/dL — AB (ref 30.0–36.0)
MCV: 91.1 fL (ref 78.0–100.0)
PLATELETS: 270 10*3/uL (ref 150–400)
RBC: 4.18 MIL/uL — ABNORMAL LOW (ref 4.22–5.81)
RDW: 11.7 % (ref 11.5–15.5)
WBC: 13.2 10*3/uL — AB (ref 4.0–10.5)

## 2016-11-01 LAB — I-STAT TROPONIN, ED: Troponin i, poc: 0.04 ng/mL (ref 0.00–0.08)

## 2016-11-01 MED ORDER — POTASSIUM CHLORIDE CRYS ER 20 MEQ PO TBCR
40.0000 meq | EXTENDED_RELEASE_TABLET | Freq: Once | ORAL | Status: AC
  Administered 2016-11-01: 40 meq via ORAL
  Filled 2016-11-01: qty 2

## 2016-11-01 MED ORDER — LORAZEPAM 1 MG PO TABS
2.0000 mg | ORAL_TABLET | Freq: Once | ORAL | Status: AC
  Administered 2016-11-01: 2 mg via ORAL
  Filled 2016-11-01: qty 2

## 2016-11-01 MED ORDER — SODIUM CHLORIDE 0.9 % IV BOLUS (SEPSIS): 1000.0000 mL | Freq: Once | INTRAVENOUS | Status: DC

## 2016-11-01 MED ORDER — DIPHENHYDRAMINE HCL 50 MG/ML IJ SOLN
25.0000 mg | Freq: Once | INTRAMUSCULAR | Status: AC
  Administered 2016-11-01: 25 mg via INTRAVENOUS
  Filled 2016-11-01: qty 1

## 2016-11-01 MED ORDER — SODIUM CHLORIDE 0.9 % IV BOLUS (SEPSIS)
1000.0000 mL | Freq: Once | INTRAVENOUS | Status: AC
  Administered 2016-11-01: 1000 mL via INTRAVENOUS

## 2016-11-01 MED ORDER — LORAZEPAM 2 MG/ML IJ SOLN
0.5000 mg | Freq: Once | INTRAMUSCULAR | Status: AC
  Administered 2016-11-01: 0.5 mg via INTRAVENOUS
  Filled 2016-11-01: qty 1

## 2016-11-01 NOTE — ED Provider Notes (Signed)
[facility]-EMERGENCY DEPT Provider Note   CSN: [id number] Arrival date & time: 11/01/16  4098     History   Chief Complaint Chief Complaint  Patient presents with  . Chest Pain  . Shortness of Breath    HPI Douglas Dunn is a 36 y.o. male.  HPI   36 year old male with history of HIV, history of IV drug abuse, and poly-substance abuse who presents with anxiety after using crystal meth. Patient states that he injected crystal meth approximately 4 hours ago. Since then, he has had dull, aching chest pain, anxiety, and paranoia. He has a history of similar symptoms in the setting of methamphetamine abuse in the past. Currently, he states he feels improved although he continues to feel anxiety. Denies any current chest pain. Denies any recent fevers or chills. He states he has otherwise been taking his medications. He states his last use was in September, at which time he was also evaluated in the emergency department. Denies any other concomitant drug use.  Past Medical History:  Diagnosis Date  . Anxiety   . Drug abuse   . HIV (human immunodeficiency virus infection) [facility])     Patient Active Problem List   Diagnosis Date Noted  . Chest pain 04/19/2015  . HIV disease (HCC) 04/19/2015  . 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 Prolonged Q-T interval on ECG 04/19/2015  . IV drug abuse 04/19/2015  . Panic disorder 04/19/2015  . Amphetamine abuse   . Anxiety     Past Surgical History:  Procedure Laterality Date  . WISDOM TOOTH EXTRACTION         Home Medications    Prior to Admission medications   Medication Sig Start Date End Date Taking? Authorizing Provider  elvitegravir-cobicistat-emtricitabine-tenofovir (STRIBILD) 150-150-200-300 MG TABS tablet Take 1 tablet by mouth daily with breakfast.   Yes Historical Provider, MD  LORazepam (ATIVAN) 1 MG tablet Take 1 tablet (1 mg total) by mouth 3 (three) times daily as needed for anxiety. Patient not taking: Reported on 11/01/2016  05/28/15   Antony Madura, PA-C  potassium chloride SA (K-DUR,KLOR-CON) 20 MEQ tablet Take 1 tablet (20 mEq total) by mouth 2 (two) times daily. Patient not taking: Reported on 04/19/2015 04/12/15   Dione Booze, MD    Family History History reviewed. No pertinent family history.  Social History Social History  Substance Use Topics  . Smoking status: Current Every Day Smoker    Packs/day: 0.50    Years: 18.00    Types: Cigarettes  . Smokeless tobacco: Never Used  . Alcohol use No     Allergies   Patient has no known allergies.   Review of Systems Review of Systems  Constitutional: Positive for fatigue. Negative for chills and fever.  HENT: Negative for congestion and rhinorrhea.   Eyes: Negative for visual disturbance.  Respiratory: Positive for chest tightness (Transient, now resolved). Negative for cough, shortness of breath and wheezing.   Cardiovascular: Negative for chest pain and leg swelling.  Gastrointestinal: Negative for abdominal pain, diarrhea, nausea and vomiting.  Genitourinary: Negative for dysuria and flank pain.  Musculoskeletal: Negative for neck pain and neck stiffness.  Skin: Negative for rash and wound.  Allergic/Immunologic: Negative for immunocompromised state.  Neurological: Negative for syncope, weakness and headaches.  Psychiatric/Behavioral: Positive for agitation. The patient is nervous/anxious.   All other systems reviewed and are negative.    Physical Exam Updated Vital Signs BP 123/78   Pulse 108   Temp 98.2 F (36.8 C) (Oral)   Resp  18   SpO2 99%   Physical Exam  Constitutional: He is oriented to person, place, and time. He appears well-developed and well-nourished. No distress.  HENT:  Head: Normocephalic and atraumatic.  Eyes: Conjunctivae are normal.  Neck: Neck supple.  Cardiovascular: Regular rhythm and normal heart sounds.  Tachycardia present.  Exam reveals no friction rub.   No murmur heard. Pulmonary/Chest: Effort normal and  breath sounds normal. No respiratory distress. He has no wheezes. He has no rales.  Abdominal: He exhibits no distension.  Musculoskeletal: He exhibits no edema.  Neurological: He is alert and oriented to person, place, and time. He exhibits normal muscle tone.  Skin: Skin is warm. Capillary refill takes less than 2 seconds.  Psychiatric: He has a normal mood and affect.  Nursing note and vitals reviewed.    ED Treatments / Results  Labs (all labs ordered are listed, but only abnormal results are displayed) Labs Reviewed  BASIC METABOLIC PANEL - Abnormal; Notable for the following:       Result Value   Potassium 3.1 (*)    CO2 21 (*)    All other components within normal limits  CBC - Abnormal; Notable for the following:    WBC 13.2 (*)    RBC 4.18 (*)    HCT 38.1 (*)    MCHC 37.0 (*)    All other components within normal limits  I-STAT TROPOININ, ED    EKG  EKG Interpretation  Date/Time:  Thursday November 01 2016 07:29:42 EST Ventricular Rate:  124 PR Interval:    QRS Duration: 98 QT Interval:  311 QTC Calculation: 447 R Axis:   85 Text Interpretation:  Sinus tachycardia Aberrant complex Probable left atrial enlargement No significant change since last tracing Baseline artifact Confirmed by Khyran Riera MD, Sheria Lang [phone]) on 11/01/2016 8:45:34 AM       Radiology No results found.  Procedures Procedures (including critical care time)  Medications Ordered in ED Medications  LORazepam (ATIVAN) tablet 2 mg (2 mg Oral Given 11/01/16 0843)  sodium chloride 0.9 % bolus 1,000 mL (0 mLs Intravenous Stopped 11/01/16 1101)  LORazepam (ATIVAN) injection 0.5 mg (0.5 mg Intravenous Given 11/01/16 0955)  diphenhydrAMINE (BENADRYL) injection 25 mg (25 mg Intravenous Given 11/01/16 0956)  potassium chloride SA (K-DUR,KLOR-CON) CR tablet 40 mEq (40 mEq Oral Given 11/01/16 1156)     Initial Impression / Assessment and Plan / ED Course  I have reviewed the triage vital signs and the nursing  notes.  Pertinent labs & imaging results that were available during my care of the patient were reviewed by me and considered in my medical decision making (see chart for details).     36 year old male with history of HIV and polysubstance abuse who presents with paranoia and chest pain after using crystal meth. Patient has history of recurrent ED visits for the same. On arrival, patient does appear to be anxious and is tachycardic, likely secondary to methamphetamine use. He was given Ativan with improvement in his heart rate. After conversation, patient is amenable to IV placement and patient given IV fluids. Screening lab work shows mild, likely reactive leukocytosis but negative troponin. EKG is nonischemic. Patient refuses chest x-ray. On repeat assessment, his heart rate has improved to the low 100s and he appears clinically to be improving. He is alert and oriented. He is able to walk without difficulty and tolerating by mouth. Advised patient to stay for x-ray as well as further fluids and monitoring but he states he  has to return home. Given absence of chest x-ray despite chest pain, patient left AMA. He understands the risks and benefits of leaving and is clinically sober at discharge.  Final Clinical Impressions(s) / ED Diagnoses   Final diagnoses:  Methamphetamine abuse  Hypokalemia    New Prescriptions Discharge Medication List as of 11/01/2016 11:42 AM       Shaune Pollackameron Sena Clouatre, MD 11/01/16 1649

## 2016-11-01 NOTE — ED Triage Notes (Signed)
Pt c/o left lower chest pain and epigastric pain onset 1 hour ago with associated SOB, arm and jaw pain, blurred vision, worse with exertion. Used methamphetamines a few hours ago. No other drugs on board.

## 2016-11-01 NOTE — ED Notes (Addendum)
Pt is still refusing blood draw. Dr Erma HeritageIsaacs aware

## 2016-11-01 NOTE — ED Notes (Signed)
Pt declined to have IV, blood work x-ray or any other tests /treatments until he talks to dr. He sts he thinks he just has a panic attack after using meth. He is worried if we will call the police, assured pt all of his information is strictly confidential.

## 2017-12-16 IMAGING — DX DG CHEST 2V
2 series · 2 of 2 positions shown · non-contrast
Comparison: 07/04/2015

CLINICAL DATA: Acute onset left-sided chest pain approximately 30
minutes ago. HIV. Prolonged QT interval on EKG.

EXAM:
CHEST  2 VIEW

[chest pa]
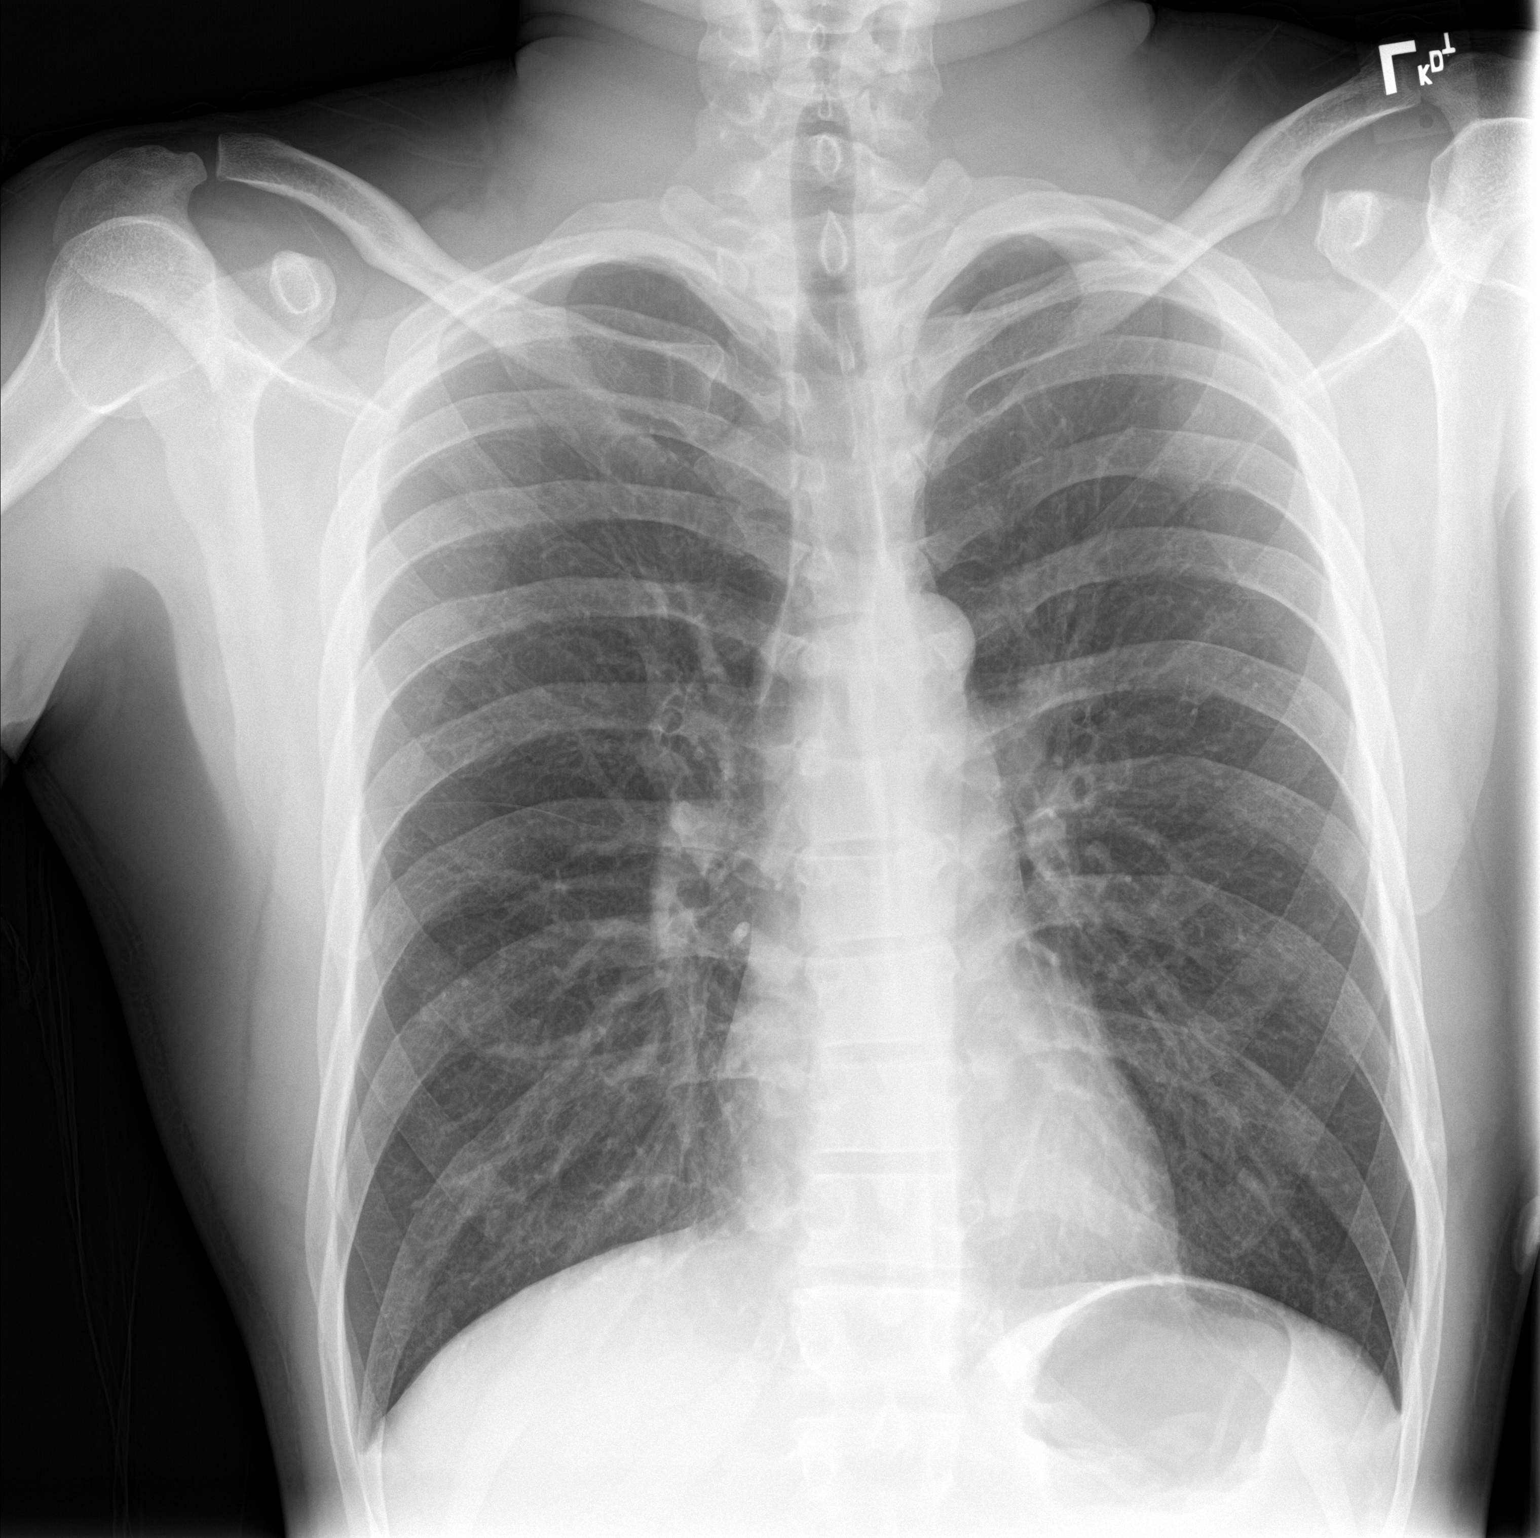

[chest lat]
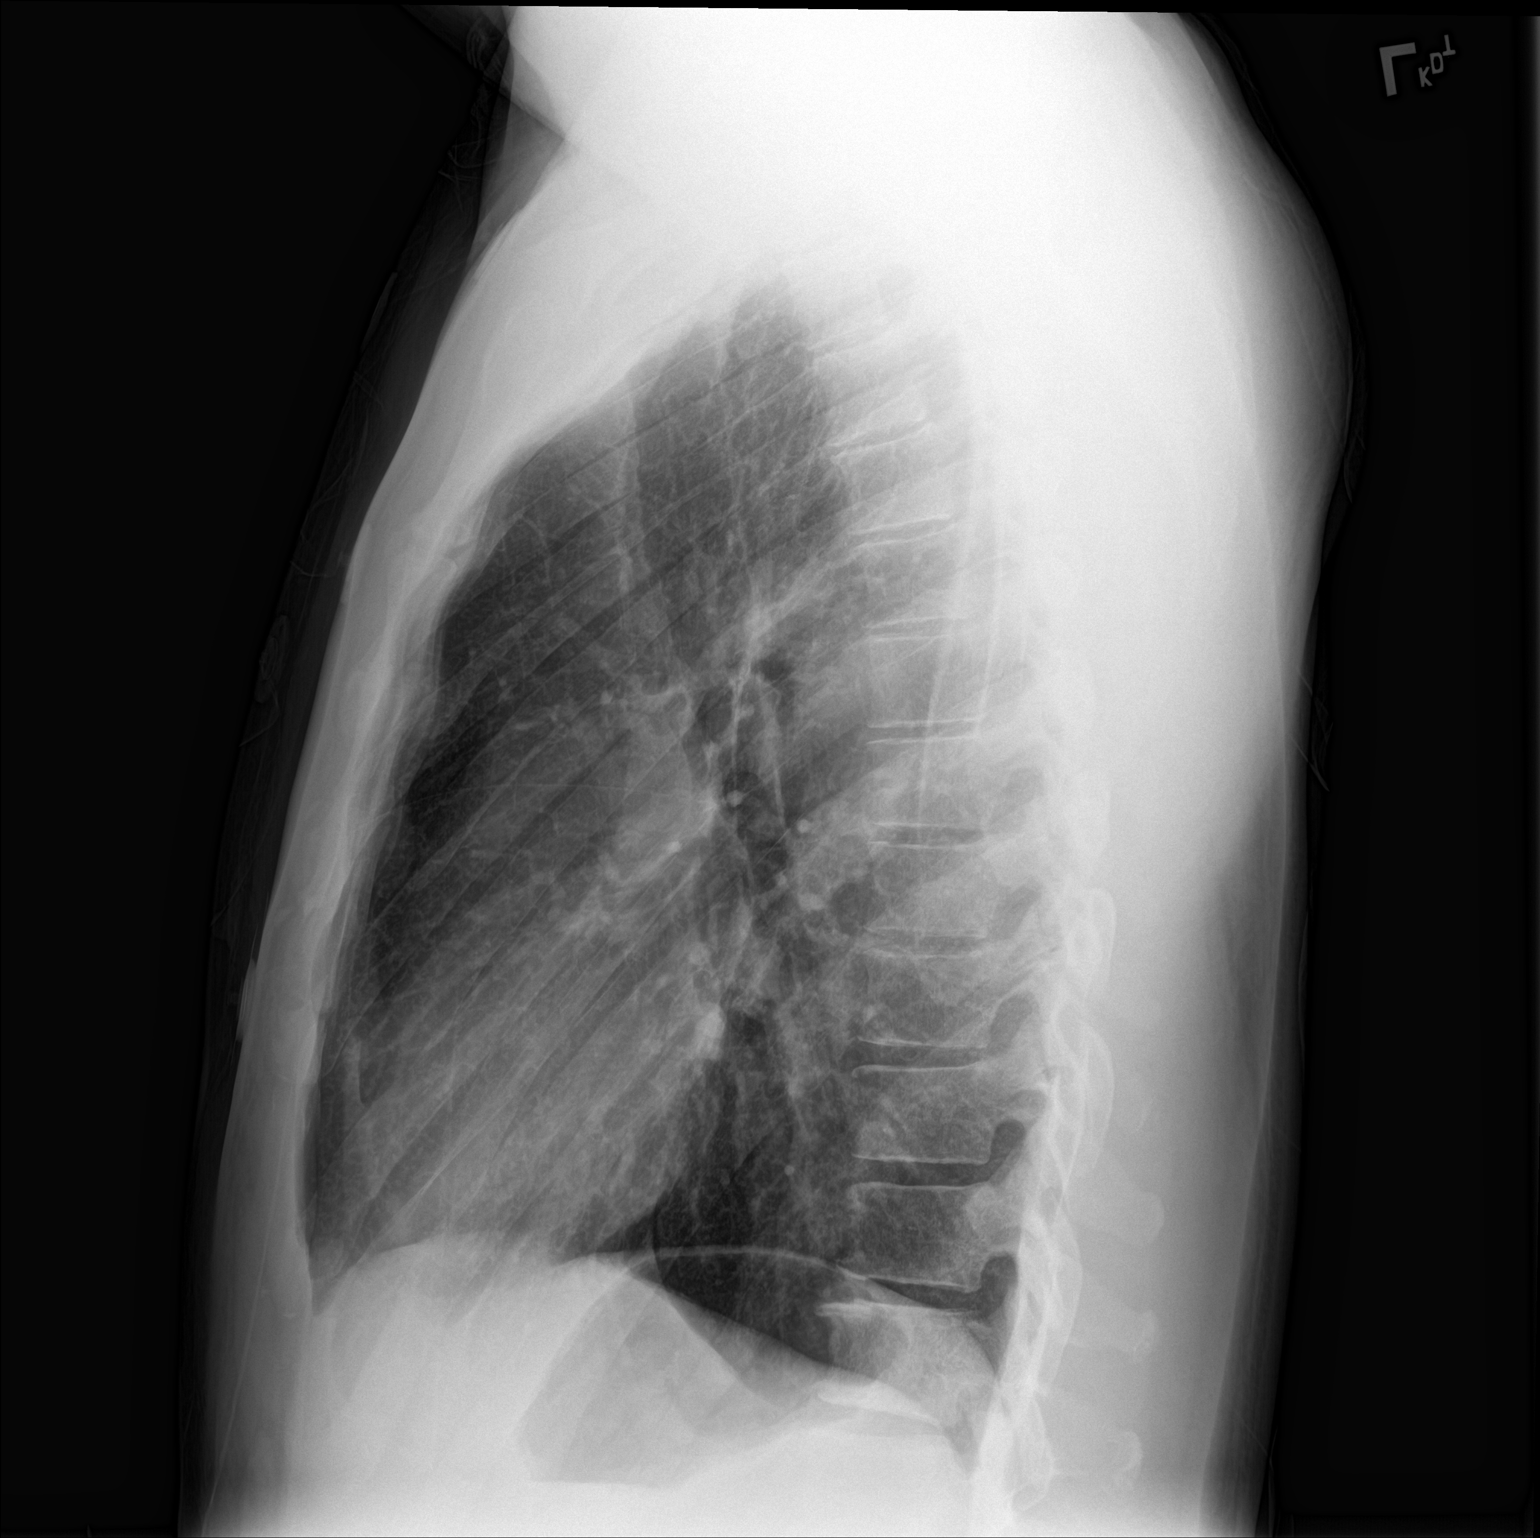

[2 of 2 positions shown; findings below may reference images not displayed]

FINDINGS: The heart size and mediastinal contours are within normal limits.
Both lungs are clear. The visualized skeletal structures are
unremarkable.
IMPRESSION: No active cardiopulmonary disease.

## 2019-08-19 ENCOUNTER — Encounter (HOSPITAL_COMMUNITY): Payer: Self-pay

## 2019-08-19 ENCOUNTER — Other Ambulatory Visit: Payer: Self-pay

## 2019-08-19 ENCOUNTER — Emergency Department (HOSPITAL_COMMUNITY)
Admission: EM | Admit: 2019-08-19 | Discharge: 2019-08-19 | Disposition: A | Discharge status: Home / Self Care | Payer: 59 | Attending: Emergency Medicine | Admitting: Emergency Medicine

## 2019-08-19 DIAGNOSIS — K0889 Other specified disorders of teeth and supporting structures: Secondary | ICD-10-CM | POA: Diagnosis present

## 2019-08-19 DIAGNOSIS — Z21 Asymptomatic human immunodeficiency virus [HIV] infection status: Secondary | ICD-10-CM | POA: Diagnosis not present

## 2019-08-19 DIAGNOSIS — Z79899 Other long term (current) drug therapy: Secondary | ICD-10-CM | POA: Diagnosis not present

## 2019-08-19 DIAGNOSIS — K047 Periapical abscess without sinus: Secondary | ICD-10-CM | POA: Diagnosis not present

## 2019-08-19 DIAGNOSIS — K029 Dental caries, unspecified: Secondary | ICD-10-CM | POA: Diagnosis not present

## 2019-08-19 DIAGNOSIS — F17210 Nicotine dependence, cigarettes, uncomplicated: Secondary | ICD-10-CM | POA: Insufficient documentation

## 2019-08-19 DIAGNOSIS — R030 Elevated blood-pressure reading, without diagnosis of hypertension: Secondary | ICD-10-CM | POA: Insufficient documentation

## 2019-08-19 MED ORDER — BUPIVACAINE-EPINEPHRINE (PF) 0.5% -1:200000 IJ SOLN
10.0000 mL | Freq: Once | INTRAMUSCULAR | Status: DC
Start: 2019-08-19 — End: 2019-08-19

## 2019-08-19 MED ORDER — BUPIVACAINE-EPINEPHRINE (PF) 0.25% -1:200000 IJ SOLN: 30.0000 mL | Freq: Once | INTRAMUSCULAR | Status: DC

## 2019-08-19 MED ORDER — LIDOCAINE-EPINEPHRINE (PF) 2 %-1:200000 IJ SOLN: 10.0000 mL | Freq: Once | INTRAMUSCULAR | Status: DC

## 2019-08-19 MED ORDER — HYDROCODONE-ACETAMINOPHEN 5-325 MG PO TABS: 1.0000 | ORAL_TABLET | Freq: Three times a day (TID) | ORAL | 0 refills | Status: AC

## 2019-08-19 NOTE — ED Triage Notes (Signed)
Pt arrives POV for eval of L sided dental pain, pt was seen at [facility] for same and started on abx but reports he cannot handle the pain anymore. Came here from [facility] because "they wouldn't help him".

## 2019-08-19 NOTE — ED Provider Notes (Addendum)
[facility] EMERGENCY DEPARTMENT Provider Note   CSN: [id number] Arrival date & time: 08/19/19  1733     History Chief Complaint  Patient presents with  . Dental Pain    Douglas Dunn is a 38 y.o. male.  HPI  Patient presents for 5 days of left-sided upper dental pain that is constant, severe, worse with chewing or touch.  Denies any fevers, chills, nausea vomiting headaches or dizziness.  States that it is making him difficult to sleep.  States he was evaluated in [place] and prescribed clindamycin which she has taken 1 dose of so far.  States that he was not prescribed any pain medication.  States that he is having difficulty sleeping.  Patient denies any anxiety, SI, HI currently.  On review of EMR it appears that he was endorsing SI during his evaluation at [facility].     Past Medical History:  Diagnosis Date  . Anxiety   . Drug abuse (HCC)   . HIV (human immunodeficiency virus infection) [facility])     Patient Active Problem List   Diagnosis Date Noted  . Chest pain 04/19/2015  . HIV disease (HCC) 04/19/2015  . Methamphetamine abuse (HCC) 04/19/2015  . Prolonged Q-T interval on ECG 04/19/2015  . IV drug abuse (HCC) 04/19/2015  . Panic disorder 04/19/2015  . Amphetamine abuse (HCC)   . Anxiety     Past Surgical History:  Procedure Laterality Date  . WISDOM TOOTH EXTRACTION         History reviewed. No pertinent family history.  Social History   Tobacco Use  . Smoking status: Current Every Day Smoker    Packs/day: 0.50    Years: 18.00    Pack years: 9.00    Types: Cigarettes  . Smokeless tobacco: Never Used  Substance Use Topics  . Alcohol use: No  . Drug use: Yes    Types: Methamphetamines    Home Medications Prior to Admission medications   Medication Sig Start Date End Date Taking? Authorizing Provider  elvitegravir-cobicistat-emtricitabine-tenofovir (STRIBILD) 150-150-200-300 MG TABS tablet Take 1 tablet by mouth daily  with breakfast.    [provider]  HYDROcodone-acetaminophen (NORCO/VICODIN) 5-325 MG tablet Take 1 tablet by mouth every 8 (eight) hours for 2 days. 08/19/19 08/21/19  Gailen Shelter, PA  LORazepam (ATIVAN) 1 MG tablet Take 1 tablet (1 mg total) by mouth 3 (three) times daily as needed for anxiety. Patient not taking: Reported on 11/01/2016 05/28/15   Antony Madura, PA-C  potassium chloride SA (K-DUR,KLOR-CON) 20 MEQ tablet Take 1 tablet (20 mEq total) by mouth 2 (two) times daily. Patient not taking: Reported on 04/19/2015 04/12/15   Dione Booze, MD    Allergies    Tramadol, Amoxicillin, Dapsone, Primaquine, and Penicillin g  Review of Systems   Review of Systems  Constitutional: Negative for chills and fever.  HENT: Negative for congestion.        Dental pain  Respiratory: Negative for shortness of breath.   Cardiovascular: Negative for chest pain.  Gastrointestinal: Negative for abdominal pain.  Musculoskeletal: Negative for neck pain.    Physical Exam Updated Vital Signs BP (!) 151/89 (BP Location: Left Arm)   Pulse 81   Temp 98 F (36.7 C) (Oral)   Resp 16   Ht 6\' 1"  (1.854 m)   Wt 83.9 kg   SpO2 100%   BMI 24.41 kg/m   Physical Exam Vitals and nursing note reviewed.  Constitutional:      General: He  is in acute distress.     Appearance: Normal appearance. He is not ill-appearing.     Comments: Well-appearing male that is not ill-appearing diaphoretic or toxic.  Does not appear to be in acute discomfort  HENT:     Head: Normocephalic and atraumatic.     Mouth/Throat:     Dentition: Abnormal dentition. Dental caries present.      Comments: Diffusely poor dentition.  Multiple caries.  No obvious abscess or areas of fluctuance.  Pain with percussion of left upper molar indicated with #1 above   No signs of Ludwig's angina.  There is no swelling of the tongue, tonsils, posterior pharynx, uvula is midline.  No tenderness to buccal mucosa or tongue Eyes:      General: No scleral icterus.       Right eye: No discharge.        Left eye: No discharge.     Conjunctiva/sclera: Conjunctivae normal.  Pulmonary:     Effort: Pulmonary effort is normal.     Breath sounds: No stridor.  Neurological:     Mental Status: He is alert and oriented to person, place, and time. Mental status is at baseline.     ED Results / Procedures / Treatments   Labs (all labs ordered are listed, but only abnormal results are displayed) Labs Reviewed - No data to display  EKG None  Radiology No results found.  Procedures Dental Block  Date/Time: 08/19/2019 11:09 PM Performed by: Tedd Sias, PA Authorized by: Tedd Sias, PA   Consent:    Consent obtained:  Verbal   Consent given by:  Patient   Risks discussed:  Allergic reaction, infection, nerve damage, pain and unsuccessful block   Alternatives discussed:  No treatment Indications:    Indications: dental pain   Location:    Block type:  Supraperiosteal   Supraperiosteal location:  Upper teeth   Upper teeth location:  13/LU 2nd bicuspid Procedure details (see MAR for exact dosages):    Needle gauge:  27 G   Anesthetic injected:  Bupivacaine 0.5% WITH epi   Injection procedure:  Anatomic landmarks identified, introduced needle, incremental injection, negative aspiration for blood and anatomic landmarks palpated Post-procedure details:    Outcome:  Pain improved   Patient tolerance of procedure:  Tolerated well, no immediate complications   (including critical care time)    Medications Ordered in ED Medications  bupivacaine-epinephrine (MARCAINE W/ EPI) 0.25% -1:200000 injection 30 mL (has no administration in time range)    ED Course  I have reviewed the triage vital signs and the nursing notes.  Pertinent labs & imaging results that were available during my care of the patient were reviewed by me and considered in my medical decision making (see chart for details).    MDM  Rules/Calculators/A&P                      Patient is otherwise well-appearing 39 year old male presented today with left upper dental pain.  Has been evaluated and treated with clindamycin.  Is presenting specifically for the pain.  I provided patient with dental block for anesthesia and 6 tablets of Norco.  Long patient education discussion and shared decision making discussion with patient as he has a history of IV drug use.  Denies any recent use.  States he will use medication only for sleeping.  States he will follow up with dentist immediately.  Patient has no signs of infection such as fever,  chills, tachycardia.  Vitals are grossly within normal notes other than elevated blood pressure which patient states is because of his severe pain.  Recommend close follow-up with primary care doctor for reevaluation of blood pressure.  Recommended close follow-up with PCP for continued care.  Recommended outpatient resources for anxiety depression and resources for drug use cessation.  Tongue is without swelling or tenderness.  Doubt Ludwig's angina.  Doubt deep space infection such as PTA/RPA.  Patient given strict return precautions.  Patient given return precautions. Will follow up with dentist.    This patient appears reasonably screened and I doubt any other medical condition requiring further workup, evaluation, or treatment in the ED at this time prior to discharge.   Patient's vitals are WNL apart from vital sign abnormalities discussed above, patient is in NAD, and able to ambulate in the ED at their baseline. Pain has been managed or a plan has been made for home management and has no complaints prior to discharge. Patient is comfortable with above plan and is stable for discharge at this time. All questions were answered prior to disposition. Results from the ER workup discussed with the patient face to face and all questions answered to the best of my ability. The patient is safe for  discharge with strict return precautions. Patient appears safe for discharge with appropriate follow-up. Conveyed my impression with the patient and they voiced understanding and are agreeable to plan.   An After Visit Summary was printed and given to the patient.  Portions of this note were generated with Scientist, clinical (histocompatibility and immunogenetics)Dragon dictation software. Dictation errors may occur despite best attempts at proofreading.     Final Clinical Impression(s) / ED Diagnoses Final diagnoses:  Dental caries  Dental infection  Pain, dental    Rx / DC Orders ED Discharge Orders         Ordered    HYDROcodone-acetaminophen (NORCO/VICODIN) 5-325 MG tablet  Every 8 hours     08/19/19 1958           Gailen ShelterFondaw, Michaelyn Wall S, [place] 08/19/19 2004    Jacalyn LefevreHaviland, Julie, MD 08/19/19 2020    Gailen ShelterFondaw, Broadus Costilla S, [place] 08/19/19 2311    Jacalyn LefevreHaviland, Julie, MD 08/19/19 2312

## 2019-08-19 NOTE — Discharge Instructions (Addendum)
Continue take clindamycin as prescribed.  I prescribed you 4 tablets of Norco for use at home.  Please nonactive alcohol.  You may make you drowsy.  Follow-up with dentistry immediately.

## 2022-09-22 ENCOUNTER — Ambulatory Visit (HOSPITAL_COMMUNITY)
Admission: EM | Admit: 2022-09-22 | Discharge: 2022-09-22 | Disposition: A | Discharge status: Home / Self Care | Payer: Medicaid Other | Attending: Nurse Practitioner | Admitting: Nurse Practitioner

## 2022-09-22 ENCOUNTER — Encounter (HOSPITAL_COMMUNITY): Payer: Self-pay

## 2022-09-22 ENCOUNTER — Other Ambulatory Visit: Payer: Self-pay

## 2022-09-22 DIAGNOSIS — F1120 Opioid dependence, uncomplicated: Secondary | ICD-10-CM | POA: Diagnosis not present

## 2022-09-22 DIAGNOSIS — F1910 Other psychoactive substance abuse, uncomplicated: Secondary | ICD-10-CM

## 2022-09-22 DIAGNOSIS — Z1152 Encounter for screening for COVID-19: Secondary | ICD-10-CM | POA: Insufficient documentation

## 2022-09-22 DIAGNOSIS — R45851 Suicidal ideations: Secondary | ICD-10-CM | POA: Diagnosis not present

## 2022-09-22 LAB — COMPREHENSIVE METABOLIC PANEL
ALT: 87 U/L — ABNORMAL HIGH (ref 0–44)
AST: 145 U/L — ABNORMAL HIGH (ref 15–41)
Albumin: 4.5 g/dL (ref 3.5–5.0)
Alkaline Phosphatase: 54 U/L (ref 38–126)
Anion gap: 12 (ref 5–15)
BUN: 20 mg/dL (ref 6–20)
CO2: 27 mmol/L (ref 22–32)
Calcium: 10.2 mg/dL (ref 8.9–10.3)
Chloride: 99 mmol/L (ref 98–111)
Creatinine, Ser: 1.19 mg/dL (ref 0.61–1.24)
GFR, Estimated: >60 mL/min (ref >60)
Glucose, Bld: 101 mg/dL — ABNORMAL HIGH (ref 70–99)
Potassium: 4 mmol/L (ref 3.5–5.1)
Sodium: 138 mmol/L (ref 135–145)
Total Bilirubin: 0.9 mg/dL (ref 0.3–1.2)
Total Protein: 7.8 g/dL (ref 6.5–8.1)

## 2022-09-22 LAB — CBC WITH DIFFERENTIAL/PLATELET
Abs Immature Granulocytes: 0.02 10*3/uL (ref 0.00–0.07)
Basophils Absolute: 0.1 10*3/uL (ref 0.0–0.1)
Basophils Relative: 1 %
Eosinophils Absolute: 0.1 10*3/uL (ref 0.0–0.5)
Eosinophils Relative: 1 %
HCT: 40.4 % (ref 39.0–52.0)
Hemoglobin: 14 g/dL (ref 13.0–17.0)
Immature Granulocytes: 0 %
Lymphocytes Relative: 15 %
Lymphs Abs: 1.3 10*3/uL (ref 0.7–4.0)
MCH: 32.3 pg (ref 26.0–34.0)
MCHC: 34.7 g/dL (ref 30.0–36.0)
MCV: 93.1 fL (ref 80.0–100.0)
Monocytes Absolute: 0.7 10*3/uL (ref 0.1–1.0)
Monocytes Relative: 9 %
Neutro Abs: 6.2 10*3/uL (ref 1.7–7.7)
Neutrophils Relative %: 74 %
Platelets: 260 10*3/uL (ref 150–400)
RBC: 4.34 MIL/uL (ref 4.22–5.81)
RDW: 11.7 % (ref 11.5–15.5)
WBC: 8.3 10*3/uL (ref 4.0–10.5)
nRBC: 0 % (ref 0.0–0.2)

## 2022-09-22 LAB — POCT URINE DRUG SCREEN - MANUAL ENTRY (I-SCREEN)
POC Amphetamine UR: POSITIVE — AB
POC Buprenorphine (BUP): NOT DETECTED
POC Cocaine UR: NOT DETECTED
POC Marijuana UR: POSITIVE — AB
POC Methadone UR: NOT DETECTED
POC Methamphetamine UR: POSITIVE — AB
POC Morphine: NOT DETECTED
POC Oxazepam (BZO): NOT DETECTED
POC Oxycodone UR: NOT DETECTED
POC Secobarbital (BAR): NOT DETECTED

## 2022-09-22 LAB — LIPID PANEL
Cholesterol: 245 mg/dL — ABNORMAL HIGH (ref 0–200)
HDL: 93 mg/dL (ref >40)
LDL Cholesterol: 140 mg/dL — ABNORMAL HIGH (ref 0–99)
Total CHOL/HDL Ratio: 2.6 RATIO
Triglycerides: 61 mg/dL (ref <150)
VLDL: 12 mg/dL (ref 0–40)

## 2022-09-22 LAB — RESP PANEL BY RT-PCR (RSV, FLU A&B, COVID)  RVPGX2
Influenza A by PCR: NEGATIVE
Influenza B by PCR: NEGATIVE
Resp Syncytial Virus by PCR: NEGATIVE
SARS Coronavirus 2 by RT PCR: NEGATIVE

## 2022-09-22 LAB — ETHANOL: Alcohol, Ethyl (B): <10 mg/dL (ref <10)

## 2022-09-22 LAB — POC SARS CORONAVIRUS 2 AG: SARSCOV2ONAVIRUS 2 AG: NEGATIVE

## 2022-09-22 MED ORDER — ACETAMINOPHEN 325 MG PO TABS
650.0000 mg | ORAL_TABLET | Freq: Four times a day (QID) | ORAL | Status: DC | PRN
  Administered 2022-09-22: 650 mg via ORAL
  Filled 2022-09-22: qty 2

## 2022-09-22 MED ORDER — TRAZODONE HCL 50 MG PO TABS
50.0000 mg | ORAL_TABLET | Freq: Every evening | ORAL | Status: DC | PRN
  Administered 2022-09-22: 50 mg via ORAL
  Filled 2022-09-22: qty 1

## 2022-09-22 MED ORDER — NICOTINE 14 MG/24HR TD PT24
14.0000 mg | MEDICATED_PATCH | Freq: Every day | TRANSDERMAL | Status: DC
  Administered 2022-09-22: 14 mg via TRANSDERMAL
  Filled 2022-09-22: qty 1

## 2022-09-22 MED ORDER — ALUM & MAG HYDROXIDE-SIMETH 200-200-20 MG/5ML PO SUSP: 30.0000 mL | ORAL | Status: DC | PRN

## 2022-09-22 MED ORDER — HYDROXYZINE HCL 25 MG PO TABS
25.0000 mg | ORAL_TABLET | Freq: Three times a day (TID) | ORAL | Status: DC | PRN
  Administered 2022-09-22 (×2): 25 mg via ORAL
  Filled 2022-09-22 (×2): qty 1

## 2022-09-22 MED ORDER — MAGNESIUM HYDROXIDE 400 MG/5ML PO SUSP: 30.0000 mL | Freq: Every day | ORAL | Status: DC | PRN

## 2022-09-22 NOTE — ED Notes (Signed)
Phone DASH pickup called and notified of pending collected STAT Labs to be transported to [facility]

## 2022-09-22 NOTE — ED Notes (Signed)
Pt alert at this hour. No apparent distress. RR even and unlabored. Monitored for safety.
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
## 2022-09-22 NOTE — Discharge Instructions (Signed)
Take all medications as prescribed. Keep all follow-up appointments as scheduled.  Do not consume alcohol or use illegal drugs while on prescription medications. Report any adverse effects from your medications to your primary care provider promptly.  In the event of recurrent symptoms or worsening symptoms, call 911, a crisis hotline, or go to the nearest emergency department for evaluation.   

## 2022-09-22 NOTE — ED Notes (Signed)
Patient a&o x3. Denies SI/HI/AVH,Denies withdrawal sx now..Denies intent or plan  to harm self or others. Routine conducted according to faculty protocol .Encourge patient to notify  staff with any needs or concerns. Patient verbalized agreement and understaffing.Will continue to monitor for safety. 

## 2022-09-22 NOTE — ED Notes (Signed)
.  Patient A&O x 4, ambulatory. Patient discharged in no acute distress. Patient denied SI/HI, A/VH upon discharge. Patient verbalized understanding of all discharge instructions explained by staff, to include follow up appointments, RX's and safety plan.  Pt belongings returned to patient from locker # 11 intact. Patient escorted to lobby via staff for transport to destination. Safety maintained.

## 2022-09-22 NOTE — ED Triage Notes (Signed)
PT presents to [facility] voluntarily by [employer]. Pt reports feelings of hopelessness and reported wishing to no longer live. Pt did report experiencing homelessness at this time. Pt has fleeting SI, but denies HI. Pt denies AVH. Pt did report drug use within 24 hours. 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 Pt is urgent.
